# Patient Record
Sex: Female | Born: 1985 | Race: Black or African American | Hispanic: No | Marital: Single | Smoking: Former smoker
Health system: Southern US, Community
[De-identification: ages and names within clinical notes are randomized; demographics above are authoritative.]

## PROBLEM LIST (undated history)

## (undated) DIAGNOSIS — T1490XA Injury, unspecified, initial encounter: Secondary | ICD-10-CM

## (undated) DIAGNOSIS — F32A Depression, unspecified: Secondary | ICD-10-CM

## (undated) DIAGNOSIS — F329 Major depressive disorder, single episode, unspecified: Secondary | ICD-10-CM

## (undated) HISTORY — DX: Major depressive disorder, single episode, unspecified: F32.9

## (undated) HISTORY — DX: Depression, unspecified: F32.A

## (undated) HISTORY — DX: Injury, unspecified, initial encounter: T14.90XA

---

## 2007-03-29 ENCOUNTER — Other Ambulatory Visit: Admission: RE | Admit: 2007-03-29 | Discharge: 2007-03-29 | Payer: Self-pay | Admitting: Obstetrics and Gynecology

## 2007-06-08 ENCOUNTER — Emergency Department (HOSPITAL_COMMUNITY): Admission: EM | Admit: 2007-06-08 | Discharge: 2007-06-08 | Payer: Self-pay | Admitting: Emergency Medicine

## 2007-06-25 ENCOUNTER — Emergency Department (HOSPITAL_COMMUNITY): Admission: EM | Admit: 2007-06-25 | Discharge: 2007-06-25 | Payer: Self-pay | Admitting: Emergency Medicine

## 2007-09-14 ENCOUNTER — Ambulatory Visit (HOSPITAL_COMMUNITY): Admission: RE | Admit: 2007-09-14 | Discharge: 2007-09-14 | Payer: Self-pay | Admitting: Obstetrics & Gynecology

## 2007-11-13 ENCOUNTER — Ambulatory Visit: Payer: Self-pay | Admitting: Obstetrics and Gynecology

## 2007-11-13 ENCOUNTER — Inpatient Hospital Stay (HOSPITAL_COMMUNITY): Admission: AD | Admit: 2007-11-13 | Discharge: 2007-11-13 | Payer: Self-pay | Admitting: Gynecology

## 2007-11-16 ENCOUNTER — Ambulatory Visit: Payer: Self-pay | Admitting: Obstetrics & Gynecology

## 2007-11-16 ENCOUNTER — Inpatient Hospital Stay (HOSPITAL_COMMUNITY): Admission: AD | Admit: 2007-11-16 | Discharge: 2007-11-18 | Payer: Self-pay | Admitting: Family Medicine

## 2008-03-26 ENCOUNTER — Other Ambulatory Visit: Admission: RE | Admit: 2008-03-26 | Discharge: 2008-03-26 | Payer: Self-pay | Admitting: Obstetrics and Gynecology

## 2010-05-11 ENCOUNTER — Encounter: Payer: Self-pay | Admitting: Obstetrics & Gynecology

## 2011-01-16 LAB — CBC
Hemoglobin: 11 — ABNORMAL LOW
Platelets: 154
RBC: 3.73 — ABNORMAL LOW
RDW: 13.7
WBC: 13.4 — ABNORMAL HIGH
WBC: 7.8

## 2011-01-16 LAB — RPR: RPR Ser Ql: NONREACTIVE

## 2011-10-09 ENCOUNTER — Emergency Department (HOSPITAL_COMMUNITY)
Admission: EM | Admit: 2011-10-09 | Discharge: 2011-10-09 | Disposition: A | Discharge status: Home / Self Care | Payer: Self-pay | Attending: Emergency Medicine | Admitting: Emergency Medicine

## 2011-10-09 ENCOUNTER — Encounter (HOSPITAL_COMMUNITY): Payer: Self-pay | Admitting: *Deleted

## 2011-10-09 DIAGNOSIS — S0181XA Laceration without foreign body of other part of head, initial encounter: Secondary | ICD-10-CM

## 2011-10-09 DIAGNOSIS — Z23 Encounter for immunization: Secondary | ICD-10-CM | POA: Insufficient documentation

## 2011-10-09 DIAGNOSIS — S0180XA Unspecified open wound of other part of head, initial encounter: Secondary | ICD-10-CM | POA: Insufficient documentation

## 2011-10-09 MED ORDER — HYDROCODONE-ACETAMINOPHEN 5-325 MG PO TABS
2.0000 | ORAL_TABLET | Freq: Once | ORAL | Status: DC
  Filled 2011-10-09: qty 2

## 2011-10-09 MED ORDER — IBUPROFEN 800 MG PO TABS
800.0000 mg | ORAL_TABLET | Freq: Once | ORAL | Status: AC
  Administered 2011-10-09: 800 mg via ORAL
  Filled 2011-10-09: qty 1

## 2011-10-09 MED ORDER — TETANUS-DIPHTH-ACELL PERTUSSIS 5-2.5-18.5 LF-MCG/0.5 IM SUSP
0.5000 mL | Freq: Once | INTRAMUSCULAR | Status: AC
  Administered 2011-10-09: 0.5 mL via INTRAMUSCULAR
  Filled 2011-10-09: qty 0.5

## 2011-10-09 MED ORDER — ONDANSETRON HCL 4 MG PO TABS
4.0000 mg | ORAL_TABLET | Freq: Once | ORAL | Status: AC
  Administered 2011-10-09: 4 mg via ORAL
  Filled 2011-10-09: qty 1

## 2011-10-09 NOTE — Discharge Instructions (Signed)
Your laceration was repaired with Dermabond. This will begin to peel off on its own in about 5-7 days. Please return if any signs of infection. Your tetanus was up dated today. Please mark your records.

## 2011-10-09 NOTE — ED Notes (Signed)
Pt alert & oriented x4, stable gait. Pt given discharge instructions, paperwork. Patient instructed to stop at the registration desk to finish any additional paperwork. pt verbalized understanding. Pt left department w/ no further questions.  
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
## 2011-10-09 NOTE — ED Provider Notes (Signed)
Medical screening examination/treatment/procedure(s) were performed by non-physician practitioner and as supervising physician I was immediately available for consultation/collaboration.   [name], MD 10/09/11 1929 
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
## 2012-03-13 ENCOUNTER — Emergency Department (HOSPITAL_COMMUNITY)
Admission: EM | Admit: 2012-03-13 | Discharge: 2012-03-13 | Disposition: A | Discharge status: Home / Self Care | Payer: Self-pay | Attending: Emergency Medicine | Admitting: Emergency Medicine

## 2012-03-13 ENCOUNTER — Encounter (HOSPITAL_COMMUNITY): Payer: Self-pay

## 2012-03-13 DIAGNOSIS — F3289 Other specified depressive episodes: Secondary | ICD-10-CM | POA: Insufficient documentation

## 2012-03-13 DIAGNOSIS — F329 Major depressive disorder, single episode, unspecified: Secondary | ICD-10-CM | POA: Insufficient documentation

## 2012-03-13 DIAGNOSIS — R45851 Suicidal ideations: Secondary | ICD-10-CM | POA: Insufficient documentation

## 2012-03-13 LAB — CBC WITH DIFFERENTIAL/PLATELET
Basophils Absolute: 0 10*3/uL (ref 0.0–0.1)
Basophils Relative: 0 % (ref 0–1)
Eosinophils Relative: 2 % (ref 0–5)
HCT: 40.6 % (ref 36.0–46.0)
Hemoglobin: 13.7 g/dL (ref 12.0–15.0)
Lymphocytes Relative: 35 % (ref 12–46)
MCH: 29.1 pg (ref 26.0–34.0)
Neutro Abs: 4.6 10*3/uL (ref 1.7–7.7)
Neutrophils Relative %: 57 % (ref 43–77)
RBC: 4.71 MIL/uL (ref 3.87–5.11)

## 2012-03-13 LAB — BASIC METABOLIC PANEL
CO2: 27 mEq/L (ref 19–32)
Calcium: 9.5 mg/dL (ref 8.4–10.5)
Creatinine, Ser: 0.82 mg/dL (ref 0.50–1.10)
GFR calc Af Amer: >90 mL/min (ref >90)
GFR calc non Af Amer: >90 mL/min (ref >90)
Sodium: 138 mEq/L (ref 135–145)

## 2012-03-13 LAB — RAPID URINE DRUG SCREEN, HOSP PERFORMED
Opiates: NOT DETECTED
Tetrahydrocannabinol: POSITIVE — AB

## 2012-03-13 LAB — ETHANOL: Alcohol, Ethyl (B): <11 mg/dL (ref 0–11)

## 2012-03-13 MED ORDER — CITALOPRAM HYDROBROMIDE 20 MG PO TABS: 20.0000 mg | ORAL_TABLET | Freq: Every day | ORAL | Status: DC

## 2012-03-13 MED ORDER — CITALOPRAM HYDROBROMIDE 40 MG PO TABS: 20.0000 mg | ORAL_TABLET | Freq: Every day | ORAL | Status: DC

## 2012-03-13 NOTE — ED Notes (Signed)
Pt states she has been unhappy for a few months. States she lay in the bed last night thinking about hanging herself

## 2012-03-14 NOTE — ED Provider Notes (Signed)
History     CSN: [id number]  Arrival date & time 03/13/12  0840   First MD Initiated Contact with Patient 03/13/12 [phone]      Chief Complaint  Patient presents with  . V70.1    (Consider location/radiation/quality/duration/timing/severity/associated sxs/prior treatment) HPI Comments: Krista Monroe presents voluntarily for assistance with worsening depression.  She reports being increasingly sad and easily tearful for the past several months.  She has had no obvious life stressors or personal losses that might explain her sadness.  She reports having days when she feels ok, but has more days with sadness and tearfulness.  Last night she had thoughts of hanging herself, yet has a 43 year old daughter and does not believe she could act on these thoughts.  She denies any significant medical history and does not have a local pcp.  She lives with her daughter,  Boyfriend and her sister.  Her boyfriend brought her here today at her request.   The history is provided by the patient.    History reviewed. No pertinent past medical history.  History reviewed. No pertinent past surgical history.  No family history on file.  History  Substance Use Topics  . Smoking status: Never Smoker   . Smokeless tobacco: Not on file  . Alcohol Use: Yes    OB History    Grav Para Term Preterm Abortions TAB SAB Ect Mult Living                  Review of Systems  Constitutional: Negative for fever.  HENT: Negative for congestion, sore throat and neck pain.   Eyes: Negative.   Respiratory: Negative for chest tightness and shortness of breath.   Cardiovascular: Negative for chest pain.  Gastrointestinal: Negative for nausea and abdominal pain.  Genitourinary: Negative.   Musculoskeletal: Negative for joint swelling and arthralgias.  Skin: Negative.  Negative for rash and wound.  Neurological: Negative for dizziness, weakness, light-headedness, numbness and headaches.  Hematological: Negative.     Psychiatric/Behavioral: Positive for suicidal ideas. Negative for hallucinations and self-injury. The patient is not nervous/anxious.     Allergies  Review of patient's allergies indicates no known allergies.  Home Medications   Current Outpatient Rx  Name  Route  Sig  Dispense  Refill  . CITALOPRAM HYDROBROMIDE 40 MG PO TABS   Oral   Take 0.5 tablets (20 mg total) by mouth daily.   30 tablet   0     BP 128/79  Pulse 68  Temp 98.4 F (36.9 C) (Oral)  Resp 18  Ht 5\' 5"  (1.651 m)  Wt 170 lb (77.111 kg)  BMI 28.29 kg/m2  SpO2 100%  LMP 03/06/2012  Physical Exam  Nursing note and vitals reviewed. Constitutional: She appears well-developed and well-nourished.  HENT:  Head: Normocephalic and atraumatic.  Eyes: Conjunctivae normal are normal.  Neck: Normal range of motion.  Cardiovascular: Normal rate, regular rhythm, normal heart sounds and intact distal pulses.   Pulmonary/Chest: Effort normal and breath sounds normal. She has no wheezes.  Abdominal: Soft. Bowel sounds are normal. There is no tenderness.  Musculoskeletal: Normal range of motion.  Neurological: She is alert.  Skin: Skin is warm and dry.  Psychiatric: Her speech is normal. Judgment normal. Her affect is blunt. Thought content is not paranoid. Cognition and memory are normal. She exhibits a depressed mood. She expresses no homicidal ideation.    ED Course  Procedures (including critical care time)  Labs Reviewed  URINE RAPID  DRUG SCREEN (HOSP PERFORMED) - Abnormal; Notable for the following:    Tetrahydrocannabinol POSITIVE (*)     All other components within normal limits  CBC WITH DIFFERENTIAL  BASIC METABOLIC PANEL  ETHANOL  LAB REPORT - SCANNED   No results found.   1. Depression    Pt was evaluated by telepsych at length.  It was felt that patient does have mild to moderate depression but without risk for active suicidal act.  Recommended celexa 20 mg daily and close f/u with behavioral  health.     MDM  Discussed with Dr. Estell Harpin prior to dc home.  Pt was prescribed celexa,  Given phone numbers to establish care with [facility],  Also given crisis 24 hour number. Asked to return here for a recheck if sx worsen at any point prior to getting established with [facility] which pt agrees to do.          Burgess Amor, PA 03/15/12 0002

## 2012-03-16 NOTE — ED Provider Notes (Signed)
Medical screening examination/treatment/procedure(s) were performed by non-physician practitioner and as supervising physician I was immediately available for consultation/collaboration.   Janila Arrazola L Shondale Quinley, MD 03/16/12 1501 

## 2013-02-22 ENCOUNTER — Encounter: Payer: Self-pay | Admitting: Obstetrics and Gynecology

## 2013-05-17 ENCOUNTER — Encounter (INDEPENDENT_AMBULATORY_CARE_PROVIDER_SITE_OTHER): Payer: Self-pay

## 2013-05-17 ENCOUNTER — Encounter: Payer: BC Managed Care – PPO | Admitting: Adult Health

## 2014-08-01 ENCOUNTER — Other Ambulatory Visit: Payer: Self-pay | Admitting: Obstetrics & Gynecology

## 2014-08-13 ENCOUNTER — Other Ambulatory Visit (HOSPITAL_COMMUNITY)
Admission: RE | Admit: 2014-08-13 | Discharge: 2014-08-13 | Disposition: A | Discharge status: Home / Self Care | Payer: BLUE CROSS/BLUE SHIELD | Source: Ambulatory Visit | Attending: Obstetrics & Gynecology | Admitting: Obstetrics & Gynecology

## 2014-08-13 ENCOUNTER — Encounter: Payer: Self-pay | Admitting: Obstetrics & Gynecology

## 2014-08-13 ENCOUNTER — Ambulatory Visit (INDEPENDENT_AMBULATORY_CARE_PROVIDER_SITE_OTHER): Payer: BLUE CROSS/BLUE SHIELD | Admitting: Obstetrics & Gynecology

## 2014-08-13 ENCOUNTER — Telehealth: Payer: Self-pay | Admitting: Obstetrics & Gynecology

## 2014-08-13 VITALS — BP 110/62 | HR 76 | Ht 65.0 in | Wt 172.5 lb

## 2014-08-13 DIAGNOSIS — Z01419 Encounter for gynecological examination (general) (routine) without abnormal findings: Secondary | ICD-10-CM | POA: Diagnosis not present

## 2014-08-13 NOTE — Telephone Encounter (Signed)
Spoke with pt. Pt was seen earlier. She wants a refill on Celexa. She has had 2  deaths in her family recently. Please advise. Thanks!! JSY

## 2014-08-13 NOTE — Progress Notes (Signed)
Patient ID: Krista HawkingAntica L Novello, female   DOB: Nov 07, 1985, 29 y.o.   MRN: [id number] Subjective:     Krista Monroe is a 29 y.o. female here for a routine exam.  Patient's last menstrual period was 08/07/2014. No obstetric history on file. Birth Control Method:  Nexplanon(in for 7 years) Menstrual Calendar(currently): regular  Current complaints: none.   Current acute medical issues:  none   Recent Gynecologic History Patient's last menstrual period was 08/07/2014. Last Pap: 2011,  normal Last mammogram: ,    Past Medical History  Diagnosis Date  . Depression     History reviewed. No pertinent past surgical history.  OB History    No data available      History   Social History  . Marital Status: Single    Spouse Name: N/A  . Number of Children: N/A  . Years of Education: N/A   Social History Main Topics  . Smoking status: Never Smoker   . Smokeless tobacco: Never Used  . Alcohol Use: Yes     Comment: 2-3 times a month  . Drug Use: No  . Sexual Activity: Yes    Birth Control/ Protection: Implant   Other Topics Concern  . None   Social History Narrative    Family History  Problem Relation Age of Onset  . Cancer Maternal Grandmother     breast     Current outpatient prescriptions:  .  etonogestrel (NEXPLANON) 68 MG IMPL implant, 1 each by Subdermal route once., Disp: , Rfl:   Review of Systems  Review of Systems  Constitutional: Negative for fever, chills, weight loss, malaise/fatigue and diaphoresis.  HENT: Negative for hearing loss, ear pain, nosebleeds, congestion, sore throat, neck pain, tinnitus and ear discharge.   Eyes: Negative for blurred vision, double vision, photophobia, pain, discharge and redness.  Respiratory: Negative for cough, hemoptysis, sputum production, shortness of breath, wheezing and stridor.   Cardiovascular: Negative for chest pain, palpitations, orthopnea, claudication, leg swelling and PND.  Gastrointestinal: negative for  abdominal pain. Negative for heartburn, nausea, vomiting, diarrhea, constipation, blood in stool and melena.  Genitourinary: Negative for dysuria, urgency, frequency, hematuria and flank pain.  Musculoskeletal: Negative for myalgias, back pain, joint pain and falls.  Skin: Negative for itching and rash.  Neurological: Negative for dizziness, tingling, tremors, sensory change, speech change, focal weakness, seizures, loss of consciousness, weakness and headaches.  Endo/Heme/Allergies: Negative for environmental allergies and polydipsia. Does not bruise/bleed easily.  Psychiatric/Behavioral: Negative for depression, suicidal ideas, hallucinations, memory loss and substance abuse. The patient is not nervous/anxious and does not have insomnia.        Objective:  Blood pressure 110/62, pulse 76, height 5\' 5"  (1.651 m), weight 172 lb 8 oz (78.245 kg), last menstrual period 08/07/2014.   Physical Exam  Vitals reviewed. Constitutional: She is oriented to person, place, and time. She appears well-developed and well-nourished.  HENT:  Head: Normocephalic and atraumatic.        Right Ear: External ear normal.  Left Ear: External ear normal.  Nose: Nose normal.  Mouth/Throat: Oropharynx is clear and moist.  Eyes: Conjunctivae and EOM are normal. Pupils are equal, round, and reactive to light. Right eye exhibits no discharge. Left eye exhibits no discharge. No scleral icterus.  Neck: Normal range of motion. Neck supple. No tracheal deviation present. No thyromegaly present.  Cardiovascular: Normal rate, regular rhythm, normal heart sounds and intact distal pulses.  Exam reveals no gallop and no friction rub.   No  murmur heard. Respiratory: Effort normal and breath sounds normal. No respiratory distress. She has no wheezes. She has no rales. She exhibits no tenderness.  GI: Soft. Bowel sounds are normal. She exhibits no distension and no mass. There is no tenderness. There is no rebound and no guarding.   Genitourinary:  Breasts no masses skin changes or nipple changes bilaterally      Vulva is normal without lesions Vagina is pink moist without discharge Cervix normal in appearance and pap is done Uterus is normal size shape and contour Adnexa is negative with normal sized ovaries  Musculoskeletal: Normal range of motion. She exhibits no edema and no tenderness.  Neurological: She is alert and oriented to person, place, and time. She has normal reflexes. She displays normal reflexes. No cranial nerve deficit. She exhibits normal muscle tone. Coordination normal.  Skin: Skin is warm and dry. No rash noted. No erythema. No pallor.  Psychiatric: She has a normal mood and affect. Her behavior is normal. Judgment and thought content normal.       Assessment:    Healthy female exam.    Plan:    Contraception: Nexplanon. Follow up in: 2 weeks.   for nexplanon removal and reinsertion

## 2014-08-14 ENCOUNTER — Telehealth: Payer: Self-pay | Admitting: Obstetrics & Gynecology

## 2014-08-14 MED ORDER — CITALOPRAM HYDROBROMIDE 40 MG PO TABS: 20.0000 mg | ORAL_TABLET | Freq: Every day | ORAL | Status: DC

## 2014-08-15 LAB — CYTOLOGY - PAP

## 2014-08-20 ENCOUNTER — Other Ambulatory Visit: Payer: Self-pay | Admitting: Obstetrics & Gynecology

## 2014-08-23 ENCOUNTER — Ambulatory Visit (INDEPENDENT_AMBULATORY_CARE_PROVIDER_SITE_OTHER): Payer: BLUE CROSS/BLUE SHIELD | Admitting: Otolaryngology

## 2014-08-28 ENCOUNTER — Encounter: Payer: BLUE CROSS/BLUE SHIELD | Admitting: Obstetrics & Gynecology

## 2014-08-28 ENCOUNTER — Encounter: Payer: Self-pay | Admitting: *Deleted

## 2014-09-10 ENCOUNTER — Encounter: Payer: BLUE CROSS/BLUE SHIELD | Admitting: Obstetrics & Gynecology

## 2014-09-18 ENCOUNTER — Encounter: Payer: Self-pay | Admitting: Obstetrics & Gynecology

## 2014-09-18 ENCOUNTER — Encounter: Payer: BLUE CROSS/BLUE SHIELD | Admitting: Obstetrics & Gynecology

## 2014-09-27 ENCOUNTER — Ambulatory Visit (INDEPENDENT_AMBULATORY_CARE_PROVIDER_SITE_OTHER): Payer: BLUE CROSS/BLUE SHIELD | Admitting: Otolaryngology

## 2015-06-13 ENCOUNTER — Ambulatory Visit: Payer: BLUE CROSS/BLUE SHIELD | Admitting: Obstetrics and Gynecology

## 2015-06-13 ENCOUNTER — Encounter: Payer: Self-pay | Admitting: *Deleted

## 2015-06-14 ENCOUNTER — Encounter (HOSPITAL_COMMUNITY): Payer: Self-pay | Admitting: Emergency Medicine

## 2015-06-14 ENCOUNTER — Emergency Department (HOSPITAL_COMMUNITY)
Admission: EM | Admit: 2015-06-14 | Discharge: 2015-06-14 | Disposition: A | Discharge status: Home / Self Care | Payer: BLUE CROSS/BLUE SHIELD | Attending: Emergency Medicine | Admitting: Emergency Medicine

## 2015-06-14 DIAGNOSIS — N611 Abscess of the breast and nipple: Secondary | ICD-10-CM | POA: Insufficient documentation

## 2015-06-14 DIAGNOSIS — F329 Major depressive disorder, single episode, unspecified: Secondary | ICD-10-CM | POA: Insufficient documentation

## 2015-06-14 DIAGNOSIS — Z79899 Other long term (current) drug therapy: Secondary | ICD-10-CM | POA: Insufficient documentation

## 2015-06-14 DIAGNOSIS — F17200 Nicotine dependence, unspecified, uncomplicated: Secondary | ICD-10-CM | POA: Insufficient documentation

## 2015-06-14 MED ORDER — SULFAMETHOXAZOLE-TRIMETHOPRIM 800-160 MG PO TABS
1.0000 | ORAL_TABLET | Freq: Once | ORAL | Status: AC
  Administered 2015-06-14: 1 via ORAL
  Filled 2015-06-14: qty 1

## 2015-06-14 MED ORDER — HYDROCODONE-ACETAMINOPHEN 5-325 MG PO TABS
1.0000 | ORAL_TABLET | Freq: Once | ORAL | Status: AC
  Administered 2015-06-14: 1 via ORAL
  Filled 2015-06-14: qty 1

## 2015-06-14 MED ORDER — HYDROCODONE-ACETAMINOPHEN 5-325 MG PO TABS: 1.0000 | ORAL_TABLET | ORAL | Status: DC | PRN

## 2015-06-14 MED ORDER — SULFAMETHOXAZOLE-TRIMETHOPRIM 800-160 MG PO TABS: 1.0000 | ORAL_TABLET | Freq: Two times a day (BID) | ORAL | Status: AC

## 2015-06-14 NOTE — ED Provider Notes (Signed)
CSN: [id number]     Arrival date & time 06/14/15  1829 History   First MD Initiated Contact with Patient 06/14/15 2017     Chief Complaint  Patient presents with  . Abscess     (Consider location/radiation/quality/duration/timing/severity/associated sxs/prior Treatment) The history is provided by the patient.   Krista Monroe is a 30 y.o. female presenting with worsening pain and swelling of her right breast with nipple discharge occuring yesterday, but none since.  She had her nipples pierced 4 months ago and has had some chronic nipple irritation associated with the piercings so took them out 2 weeks ago when right breast started to get sore.  Since she has had increasing pain, swelling and hardness along her inferior breast with small amount of white discharge from the nipple yesterday.  She has tried warm compresses without relief of symptoms. She has had no fevers, chills, nausea or vomiting.  LMP unknown on Nexplanon.       Past Medical History  Diagnosis Date  . Depression    History reviewed. No pertinent past surgical history. Family History  Problem Relation Age of Onset  . Cancer Maternal Grandmother     breast   Social History  Substance Use Topics  . Smoking status: Current Every Day Smoker  . Smokeless tobacco: Never Used  . Alcohol Use: Yes     Comment: 2-3 times a month   OB History    No data available     Review of Systems  Constitutional: Negative for fever and chills.  HENT: Negative for congestion and sore throat.   Eyes: Negative.   Respiratory: Negative for chest tightness and shortness of breath.   Cardiovascular: Negative for chest pain.  Gastrointestinal: Negative for nausea, vomiting and abdominal pain.  Genitourinary: Negative.   Musculoskeletal: Negative for joint swelling, arthralgias and neck pain.  Skin: Positive for color change. Negative for rash and wound.  Neurological: Negative for dizziness, weakness, light-headedness, numbness  and headaches.  Psychiatric/Behavioral: Negative.       Allergies  Review of patient's allergies indicates no known allergies.  Home Medications   Prior to Admission medications   Medication Sig Start Date End Date Taking? Authorizing Provider  citalopram (CELEXA) 40 MG tablet Take 0.5 tablets (20 mg total) by mouth daily. 08/14/14   Lazaro Arms, MD  etonogestrel (NEXPLANON) 68 MG IMPL implant 1 each by Subdermal route once.    Historical Provider, MD  HYDROcodone-acetaminophen (NORCO/VICODIN) 5-325 MG tablet Take 1-2 tablets by mouth every 4 (four) hours as needed. 06/14/15   Burgess Amor, PA-C  sulfamethoxazole-trimethoprim (BACTRIM DS,SEPTRA DS) 800-160 MG tablet Take 1 tablet by mouth 2 (two) times daily. 06/14/15 06/21/15  Burgess Amor, PA-C   BP 137/64 mmHg  Pulse 84  Temp(Src) 99.5 F (37.5 C) (Oral)  Resp 14  Ht  (1.651 m)  Wt 77.111 kg  BMI 28.29 kg/m2  SpO2 100%  LMP 06/05/2015 Physical Exam  Constitutional: She appears well-developed and well-nourished.  HENT:  Head: Normocephalic and atraumatic.  Eyes: Conjunctivae are normal.  Neck: Normal range of motion.  Cardiovascular: Normal rate, regular rhythm, normal heart sounds and intact distal pulses.   Pulmonary/Chest: Effort normal and breath sounds normal. She has no wheezes.  Abdominal: Soft. Bowel sounds are normal. There is no tenderness.  Musculoskeletal: Normal range of motion.  Lymphadenopathy:    She has no cervical adenopathy.    She has no axillary adenopathy.  Neurological: She is alert.  Skin: Skin  is warm and dry.  Approximate 8 cm area of tender induration right infer and lateal breast extending to the nipple. No drainage. Skin intact without area of pointing. Nipple is soft without discharge.   Psychiatric: She has a normal mood and affect.  Nursing note and vitals reviewed.   ED Course  Procedures (including critical care time) Labs Review Labs Reviewed - No data to display  Imaging  Review No results found. I have personally reviewed and evaluated these images and lab results as part of my medical decision-making.   EKG Interpretation None      MDM   Final diagnoses:  Breast abscess    Discussed with Dr. Lovell Sheehan who agrees with abx and warm soaks, close office f/u.  He will see her in 4 days in his office, in the interim, requested [place] which has been scheduled for Monday am.  Bactrim, hydrocodone prescribed.  Discussed reasons to return sooner including worsened pain, swelling, fevers or any new sx.  Pt aware and agrees with plan.     Burgess Amor, PA-C 06/15/15 1204  Samuel Jester, DO 06/18/15 1227

## 2015-06-14 NOTE — ED Notes (Signed)
Patient states "I had my nipple pierced but two weeks ago I noticed some swelling and pain in my right breast so I took the piercing out but now its swollen and hurting worse." States area has drained "white and creamy" drainage.

## 2015-06-16 ENCOUNTER — Other Ambulatory Visit (HOSPITAL_COMMUNITY): Payer: Self-pay | Admitting: Emergency Medicine

## 2015-06-16 DIAGNOSIS — N611 Abscess of the breast and nipple: Secondary | ICD-10-CM

## 2015-06-17 ENCOUNTER — Inpatient Hospital Stay (HOSPITAL_COMMUNITY): Admit: 2015-06-17 | Payer: BLUE CROSS/BLUE SHIELD

## 2015-06-18 ENCOUNTER — Ambulatory Visit (HOSPITAL_COMMUNITY)
Admission: RE | Admit: 2015-06-18 | Discharge: 2015-06-18 | Disposition: A | Discharge status: Home / Self Care | Payer: BLUE CROSS/BLUE SHIELD | Source: Ambulatory Visit | Attending: Emergency Medicine | Admitting: Emergency Medicine

## 2015-06-18 DIAGNOSIS — N611 Abscess of the breast and nipple: Secondary | ICD-10-CM | POA: Insufficient documentation

## 2015-06-20 ENCOUNTER — Ambulatory Visit (INDEPENDENT_AMBULATORY_CARE_PROVIDER_SITE_OTHER): Payer: BLUE CROSS/BLUE SHIELD | Admitting: Otolaryngology

## 2016-06-20 ENCOUNTER — Emergency Department (HOSPITAL_COMMUNITY): Payer: Self-pay

## 2016-06-20 ENCOUNTER — Encounter (HOSPITAL_COMMUNITY): Payer: Self-pay | Admitting: Cardiology

## 2016-06-20 ENCOUNTER — Emergency Department (HOSPITAL_COMMUNITY)
Admission: EM | Admit: 2016-06-20 | Discharge: 2016-06-20 | Disposition: A | Discharge status: Home / Self Care | Payer: Self-pay | Attending: Emergency Medicine | Admitting: Emergency Medicine

## 2016-06-20 DIAGNOSIS — R059 Cough, unspecified: Secondary | ICD-10-CM

## 2016-06-20 DIAGNOSIS — R042 Hemoptysis: Secondary | ICD-10-CM | POA: Insufficient documentation

## 2016-06-20 DIAGNOSIS — R05 Cough: Secondary | ICD-10-CM

## 2016-06-20 DIAGNOSIS — F17210 Nicotine dependence, cigarettes, uncomplicated: Secondary | ICD-10-CM | POA: Insufficient documentation

## 2016-06-20 NOTE — ED Provider Notes (Signed)
[facility] Provider Note   CSN: [id number] Arrival date & time: 06/20/16  0740     History   Chief Complaint Chief Complaint  Patient presents with  . Hemoptysis    HPI Krista Monroe is a 31 y.o. female.  Patient presents with hemoptysis since last night. Patient's had cough congestion for 2 days and had 2 episodes of light or color blood. No history of similar. Patient denies risk factors for blood clots or bleeding disorders. No blood thinners. Patient is not short of breath. No leg swelling. No significant sick contacts no recent travel. Patient is a cigarette smoker.      Past Medical History:  Diagnosis Date  . Depression     There are no active problems to display for this patient.   History reviewed. No pertinent surgical history.  OB History    No data available       Home Medications    Prior to Admission medications   Medication Sig Start Date End Date Taking? Authorizing Provider  citalopram (CELEXA) 40 MG tablet Take 0.5 tablets (20 mg total) by mouth daily. 08/14/14   Lazaro ArmsLuther H Eure, MD  etonogestrel (NEXPLANON) 68 MG IMPL implant 1 each by Subdermal route once.    Historical Provider, MD  HYDROcodone-acetaminophen (NORCO/VICODIN) 5-325 MG tablet Take 1-2 tablets by mouth every 4 (four) hours as needed. 06/14/15   Burgess AmorJulie Idol, PA-C    Family History Family History  Problem Relation Age of Onset  . Cancer Maternal Grandmother     breast    Social History Social History  Substance Use Topics  . Smoking status: Current Every Day Smoker  . Smokeless tobacco: Never Used  . Alcohol use Yes     Comment: 2-3 times a month     Allergies   Patient has no known allergies.   Review of Systems Review of Systems  Constitutional: Negative for chills and fever.  HENT: Negative for congestion.   Eyes: Negative for visual disturbance.  Respiratory: Positive for cough. Negative for shortness of breath.   Cardiovascular: Negative for chest  pain and leg swelling.  Gastrointestinal: Negative for abdominal pain and vomiting.  Genitourinary: Negative for dysuria and flank pain.  Musculoskeletal: Negative for back pain, neck pain and neck stiffness.  Skin: Negative for rash.  Neurological: Negative for light-headedness and headaches.     Physical Exam Updated Vital Signs BP 112/64 (BP Location: Right Arm)   Pulse 72   Temp 98 F (36.7 C) (Oral)   Resp 16   Ht 5\' 5"  (1.651 m)   Wt 170 lb (77.1 kg)   SpO2 100%   BMI 28.29 kg/m   Physical Exam  Constitutional: She appears well-developed and well-nourished. No distress.  HENT:  Head: Normocephalic and atraumatic.  Eyes: Conjunctivae are normal.  Neck: Neck supple.  Cardiovascular: Normal rate and regular rhythm.   No murmur heard. Pulmonary/Chest: Effort normal and breath sounds normal. No respiratory distress.  Abdominal: Soft. There is no tenderness.  Musculoskeletal: She exhibits no edema.  Neurological: She is alert.  Skin: Skin is warm and dry.  Psychiatric: She has a normal mood and affect.  Nursing note and vitals reviewed.    ED Treatments / Results  Labs (all labs ordered are listed, but only abnormal results are displayed) Labs Reviewed - No data to display  EKG  EKG Interpretation None       Radiology Dg Chest 2 View  Result Date: 06/20/2016 CLINICAL DATA:  Cough  and dyspnea. EXAM: CHEST  2 VIEW COMPARISON:  None. FINDINGS: Normal heart size. Normal mediastinal contour. No pneumothorax. No pleural effusion. Lungs appear clear, with no acute consolidative airspace disease and no pulmonary edema. IMPRESSION: No active cardiopulmonary disease. Electronically Signed   By: Delbert Phenix M.D.   On: 06/20/2016 08:44    Procedures Procedures (including critical care time)  Medications Ordered in ED Medications - No data to display   Initial Impression / Assessment and Plan / ED Course  I have reviewed the triage vital signs and the nursing  notes.  Pertinent labs & imaging results that were available during my care of the patient were reviewed by me and considered in my medical decision making (see chart for details).   patient well-appearing presents after 2 mild episodes of hemoptysis coughing. Patient has no blood clot risk factors is not short of breath vital signs unremarkable. Clinically most likely viral respiratory infection and should clear up in the near future. Discussed reasons to return. No indication for blood work or further workup at this time.  Smoking cessation and outpatient follow up was discussed with the patient for 3 to 5  Minutes.   Results and differential diagnosis were discussed with the patient/parent/guardian. Xrays were independently reviewed by myself.  Close follow up outpatient was discussed, comfortable with the plan.   Medications - No data to display  Vitals:   06/20/16 0749  BP: 112/64  Pulse: 72  Resp: 16  Temp: 98 F (36.7 C)  TempSrc: Oral  SpO2: 100%  Weight: 170 lb (77.1 kg)  Height: 5\' 5"  (1.651 m)    Final diagnoses:  Hemoptysis  Cough     Final Clinical Impressions(s) / ED Diagnoses   Final diagnoses:  Hemoptysis  Cough    New Prescriptions New Prescriptions   No medications on file     Blane Ohara, MD 06/20/16 [phone]

## 2016-06-20 NOTE — Discharge Instructions (Signed)
Please quit smoking she is possible. Follow-up with your primary doctor next week. Return the ER if he develop shortness of breath, leg swelling, fevers or new concerns.  If you were given medicines take as directed.  If you are on coumadin or contraceptives realize their levels and effectiveness is altered by many different medicines.  If you have any reaction (rash, tongues swelling, other) to the medicines stop taking and see a physician.    If your blood pressure was elevated in the ER make sure you follow up for management with a primary doctor or return for chest pain, shortness of breath or stroke symptoms.  Please follow up as directed and return to the ER or see a physician for new or worsening symptoms.  Thank you. Vitals:   06/20/16 0749  BP: 112/64  Pulse: 72  Resp: 16  Temp: 98 F (36.7 C)  TempSrc: Oral  SpO2: 100%  Weight: 170 lb (77.1 kg)  Height: 5\' 5"  (1.651 m)

## 2016-06-20 NOTE — ED Triage Notes (Signed)
Coughing up blood since last night.

## 2016-10-24 ENCOUNTER — Emergency Department (HOSPITAL_COMMUNITY): Payer: No Typology Code available for payment source

## 2016-10-24 ENCOUNTER — Emergency Department (HOSPITAL_COMMUNITY)
Admission: EM | Admit: 2016-10-24 | Discharge: 2016-10-24 | Disposition: A | Discharge status: Home / Self Care | Payer: No Typology Code available for payment source | Attending: Emergency Medicine | Admitting: Emergency Medicine

## 2016-10-24 ENCOUNTER — Encounter (HOSPITAL_COMMUNITY): Payer: Self-pay

## 2016-10-24 DIAGNOSIS — S51012A Laceration without foreign body of left elbow, initial encounter: Secondary | ICD-10-CM | POA: Diagnosis not present

## 2016-10-24 DIAGNOSIS — Y92410 Unspecified street and highway as the place of occurrence of the external cause: Secondary | ICD-10-CM | POA: Insufficient documentation

## 2016-10-24 DIAGNOSIS — Y939 Activity, unspecified: Secondary | ICD-10-CM | POA: Insufficient documentation

## 2016-10-24 DIAGNOSIS — M546 Pain in thoracic spine: Secondary | ICD-10-CM | POA: Insufficient documentation

## 2016-10-24 DIAGNOSIS — Y999 Unspecified external cause status: Secondary | ICD-10-CM | POA: Insufficient documentation

## 2016-10-24 DIAGNOSIS — F17200 Nicotine dependence, unspecified, uncomplicated: Secondary | ICD-10-CM | POA: Insufficient documentation

## 2016-10-24 DIAGNOSIS — S5002XA Contusion of left elbow, initial encounter: Secondary | ICD-10-CM

## 2016-10-24 MED ORDER — KETOROLAC TROMETHAMINE 60 MG/2ML IM SOLN
30.0000 mg | Freq: Once | INTRAMUSCULAR | Status: AC
  Administered 2016-10-24: 30 mg via INTRAMUSCULAR
  Filled 2016-10-24: qty 2

## 2016-10-24 MED ORDER — NAPROXEN 500 MG PO TABS
500.0000 mg | ORAL_TABLET | Freq: Two times a day (BID) | ORAL | 0 refills | Status: DC
Start: 2016-10-24 — End: 2017-10-05

## 2016-10-24 MED ORDER — METHOCARBAMOL 500 MG PO TABS: 500.0000 mg | ORAL_TABLET | Freq: Four times a day (QID) | ORAL | 0 refills | Status: DC
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
## 2016-10-24 NOTE — Discharge Instructions (Signed)
Your x-rays are normal today. Apply antibiotic ointment to the laceration twice a day. Keep it covered. Elevate your  arm, ice several times a day to reduce swelling. Take naproxen for pain and inflammation. Take Robaxin for muscle spasms, do not take if driving or going to work. Follow-up with your family doctor as needed.

## 2016-10-24 NOTE — ED Notes (Signed)
To radiology

## 2016-10-24 NOTE — ED Notes (Signed)
PA in to assess 
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
## 2016-10-26 ENCOUNTER — Encounter (HOSPITAL_COMMUNITY): Payer: Self-pay | Admitting: Adult Health

## 2016-10-26 ENCOUNTER — Emergency Department (HOSPITAL_COMMUNITY)
Admission: EM | Admit: 2016-10-26 | Discharge: 2016-10-26 | Disposition: A | Discharge status: Home / Self Care | Payer: No Typology Code available for payment source | Attending: Emergency Medicine | Admitting: Emergency Medicine

## 2016-10-26 DIAGNOSIS — Z09 Encounter for follow-up examination after completed treatment for conditions other than malignant neoplasm: Secondary | ICD-10-CM | POA: Diagnosis present

## 2016-10-26 DIAGNOSIS — Z79899 Other long term (current) drug therapy: Secondary | ICD-10-CM | POA: Insufficient documentation

## 2016-10-26 MED ORDER — METHOCARBAMOL 500 MG PO TABS
500.0000 mg | ORAL_TABLET | Freq: Once | ORAL | Status: AC
  Administered 2016-10-26: 500 mg via ORAL
  Filled 2016-10-26: qty 1

## 2016-10-26 MED ORDER — NAPROXEN 250 MG PO TABS
500.0000 mg | ORAL_TABLET | Freq: Once | ORAL | Status: AC
  Administered 2016-10-26: 500 mg via ORAL
  Filled 2016-10-26: qty 2
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
## 2016-11-10 ENCOUNTER — Other Ambulatory Visit (HOSPITAL_COMMUNITY)
Admission: RE | Admit: 2016-11-10 | Discharge: 2016-11-10 | Disposition: A | Discharge status: Home / Self Care | Payer: PRIVATE HEALTH INSURANCE | Source: Ambulatory Visit | Attending: Obstetrics & Gynecology | Admitting: Obstetrics & Gynecology

## 2016-11-10 ENCOUNTER — Ambulatory Visit (INDEPENDENT_AMBULATORY_CARE_PROVIDER_SITE_OTHER): Payer: Medicaid Other | Admitting: Obstetrics & Gynecology

## 2016-11-10 ENCOUNTER — Encounter: Payer: Self-pay | Admitting: Obstetrics & Gynecology

## 2016-11-10 VITALS — BP 122/68 | HR 74 | Ht 67.0 in | Wt 179.0 lb

## 2016-11-10 DIAGNOSIS — Z3009 Encounter for other general counseling and advice on contraception: Secondary | ICD-10-CM | POA: Insufficient documentation

## 2016-11-10 DIAGNOSIS — F331 Major depressive disorder, recurrent, moderate: Secondary | ICD-10-CM

## 2016-11-10 DIAGNOSIS — Z309 Encounter for contraceptive management, unspecified: Secondary | ICD-10-CM

## 2016-11-10 DIAGNOSIS — Z01419 Encounter for gynecological examination (general) (routine) without abnormal findings: Secondary | ICD-10-CM

## 2016-11-10 MED ORDER — CITALOPRAM HYDROBROMIDE 20 MG PO TABS: 20.0000 mg | ORAL_TABLET | Freq: Every day | ORAL | 2 refills | Status: DC

## 2016-11-10 NOTE — Addendum Note (Signed)
Addended by: Tish FredericksonLANCASTER, Adrain Nesbit A on: 11/10/2016 11:32 AM   Modules accepted: Orders
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
## 2016-11-11 LAB — CYTOLOGY - PAP
CHLAMYDIA, DNA PROBE: NEGATIVE
DIAGNOSIS: NEGATIVE
HPV: NOT DETECTED
Neisseria Gonorrhea: NEGATIVE

## 2016-11-11 LAB — RPR: RPR: NONREACTIVE

## 2016-11-11 LAB — HIV ANTIBODY (ROUTINE TESTING W REFLEX): HIV Screen 4th Generation wRfx: NONREACTIVE

## 2016-12-10 ENCOUNTER — Encounter: Payer: Medicaid Other | Admitting: Obstetrics & Gynecology

## 2016-12-18 ENCOUNTER — Encounter: Payer: Medicaid Other | Admitting: Obstetrics & Gynecology

## 2016-12-29 ENCOUNTER — Encounter: Payer: Self-pay | Admitting: Obstetrics & Gynecology

## 2016-12-29 ENCOUNTER — Encounter: Payer: Self-pay | Admitting: *Deleted

## 2016-12-29 ENCOUNTER — Ambulatory Visit: Payer: PRIVATE HEALTH INSURANCE | Admitting: Family Medicine

## 2017-09-09 ENCOUNTER — Encounter: Payer: Self-pay | Admitting: *Deleted

## 2017-09-09 ENCOUNTER — Encounter: Payer: Self-pay | Admitting: Advanced Practice Midwife

## 2017-10-05 ENCOUNTER — Other Ambulatory Visit: Payer: Self-pay

## 2017-10-05 ENCOUNTER — Emergency Department (HOSPITAL_COMMUNITY): Payer: Medicaid Other

## 2017-10-05 ENCOUNTER — Encounter (HOSPITAL_COMMUNITY): Payer: Self-pay | Admitting: Emergency Medicine

## 2017-10-05 ENCOUNTER — Emergency Department (HOSPITAL_COMMUNITY)
Admission: EM | Admit: 2017-10-05 | Discharge: 2017-10-05 | Disposition: A | Discharge status: Home / Self Care | Payer: Medicaid Other | Attending: Emergency Medicine | Admitting: Emergency Medicine

## 2017-10-05 DIAGNOSIS — Z3A01 Less than 8 weeks gestation of pregnancy: Secondary | ICD-10-CM | POA: Diagnosis not present

## 2017-10-05 DIAGNOSIS — R102 Pelvic and perineal pain: Secondary | ICD-10-CM | POA: Diagnosis not present

## 2017-10-05 DIAGNOSIS — Y999 Unspecified external cause status: Secondary | ICD-10-CM | POA: Insufficient documentation

## 2017-10-05 DIAGNOSIS — Y939 Activity, unspecified: Secondary | ICD-10-CM | POA: Insufficient documentation

## 2017-10-05 DIAGNOSIS — Y929 Unspecified place or not applicable: Secondary | ICD-10-CM | POA: Diagnosis not present

## 2017-10-05 DIAGNOSIS — O26891 Other specified pregnancy related conditions, first trimester: Secondary | ICD-10-CM

## 2017-10-05 DIAGNOSIS — Z79899 Other long term (current) drug therapy: Secondary | ICD-10-CM | POA: Insufficient documentation

## 2017-10-05 DIAGNOSIS — O9A211 Injury, poisoning and certain other consequences of external causes complicating pregnancy, first trimester: Secondary | ICD-10-CM | POA: Diagnosis present

## 2017-10-05 DIAGNOSIS — S022XXA Fracture of nasal bones, initial encounter for closed fracture: Secondary | ICD-10-CM | POA: Diagnosis not present

## 2017-10-05 DIAGNOSIS — Z3490 Encounter for supervision of normal pregnancy, unspecified, unspecified trimester: Secondary | ICD-10-CM

## 2017-10-05 LAB — CBC WITH DIFFERENTIAL/PLATELET
Basophils Absolute: 0 10*3/uL (ref 0.0–0.1)
Basophils Relative: 0 %
Eosinophils Absolute: 0.1 10*3/uL (ref 0.0–0.7)
Eosinophils Relative: 1 %
HCT: 39.9 % (ref 36.0–46.0)
HEMOGLOBIN: 13.4 g/dL (ref 12.0–15.0)
LYMPHS ABS: 3.5 10*3/uL (ref 0.7–4.0)
LYMPHS PCT: 29 %
MCH: 29.8 pg (ref 26.0–34.0)
MCHC: 33.6 g/dL (ref 30.0–36.0)
MCV: 88.7 fL (ref 78.0–100.0)
Monocytes Absolute: 0.9 10*3/uL (ref 0.1–1.0)
Monocytes Relative: 7 %
NEUTROS PCT: 63 %
Neutro Abs: 7.8 10*3/uL — ABNORMAL HIGH (ref 1.7–7.7)
Platelets: 217 10*3/uL (ref 150–400)
RBC: 4.5 MIL/uL (ref 3.87–5.11)
RDW: 12.7 % (ref 11.5–15.5)
WBC: 12.3 10*3/uL — ABNORMAL HIGH (ref 4.0–10.5)

## 2017-10-05 LAB — BASIC METABOLIC PANEL
ANION GAP: 7 (ref 5–15)
BUN: 9 mg/dL (ref 6–20)
CHLORIDE: 101 mmol/L (ref 101–111)
CO2: 25 mmol/L (ref 22–32)
Calcium: 9.3 mg/dL (ref 8.9–10.3)
Creatinine, Ser: 0.67 mg/dL (ref 0.44–1.00)
GFR calc non Af Amer: >60 mL/min (ref >60)
Glucose, Bld: 81 mg/dL (ref 65–99)
POTASSIUM: 3.6 mmol/L (ref 3.5–5.1)
SODIUM: 133 mmol/L — AB (ref 135–145)

## 2017-10-05 LAB — HCG, QUANTITATIVE, PREGNANCY: hCG, Beta Chain, Quant, S: 42545 m[IU]/mL — ABNORMAL HIGH (ref <5)

## 2017-10-05 MED ORDER — PRENATAL VITAMIN 27-0.8 MG PO TABS: 1.0000 | ORAL_TABLET | Freq: Every day | ORAL | 3 refills | Status: DC
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
## 2017-10-06 ENCOUNTER — Encounter: Payer: Self-pay | Admitting: Obstetrics and Gynecology

## 2017-10-06 DIAGNOSIS — T7491XA Unspecified adult maltreatment, confirmed, initial encounter: Secondary | ICD-10-CM | POA: Insufficient documentation

## 2017-10-08 ENCOUNTER — Other Ambulatory Visit: Payer: Self-pay | Admitting: Obstetrics & Gynecology

## 2017-10-13 ENCOUNTER — Encounter: Payer: Self-pay | Admitting: Adult Health

## 2017-10-13 ENCOUNTER — Ambulatory Visit: Payer: Medicaid Other | Admitting: Adult Health

## 2017-10-13 ENCOUNTER — Telehealth: Payer: Self-pay | Admitting: Adult Health

## 2017-10-13 VITALS — BP 103/57 | HR 78 | Ht 65.0 in | Wt 161.5 lb

## 2017-10-13 DIAGNOSIS — Z3042 Encounter for surveillance of injectable contraceptive: Secondary | ICD-10-CM | POA: Insufficient documentation

## 2017-10-13 DIAGNOSIS — O3680X0 Pregnancy with inconclusive fetal viability, not applicable or unspecified: Secondary | ICD-10-CM

## 2017-10-13 DIAGNOSIS — N926 Irregular menstruation, unspecified: Secondary | ICD-10-CM

## 2017-10-13 DIAGNOSIS — Z975 Presence of (intrauterine) contraceptive device: Secondary | ICD-10-CM

## 2017-10-13 DIAGNOSIS — Z3201 Encounter for pregnancy test, result positive: Secondary | ICD-10-CM | POA: Diagnosis not present

## 2017-10-13 DIAGNOSIS — Z8659 Personal history of other mental and behavioral disorders: Secondary | ICD-10-CM | POA: Diagnosis not present

## 2017-10-13 DIAGNOSIS — Z3A01 Less than 8 weeks gestation of pregnancy: Secondary | ICD-10-CM | POA: Insufficient documentation

## 2017-10-13 LAB — POCT URINE PREGNANCY: Preg Test, Ur: POSITIVE — AB

## 2017-10-13 NOTE — Patient Instructions (Signed)
First Trimester of Pregnancy The first trimester of pregnancy is from week 1 until the end of week 13 (months 1 through 3). A week after a sperm fertilizes an egg, the egg will implant on the wall of the uterus. This embryo will begin to develop into a baby. Genes from you and your partner will form the baby. The female genes will determine whether the baby will be a boy or a girl. At 6-8 weeks, the eyes and face will be formed, and the heartbeat can be seen on ultrasound. At the end of 12 weeks, all the baby's organs will be formed. Now that you are pregnant, you will want to do everything you can to have a healthy baby. Two of the most important things are to get good prenatal care and to follow your health care provider's instructions. Prenatal care is all the medical care you receive before the baby's birth. This care will help prevent, find, and treat any problems during the pregnancy and childbirth. Body changes during your first trimester Your body goes through many changes during pregnancy. The changes vary from woman to woman.  You may gain or lose a couple of pounds at first.  You may feel sick to your stomach (nauseous) and you may throw up (vomit). If the vomiting is uncontrollable, call your health care provider.  You may tire easily.  You may develop headaches that can be relieved by medicines. All medicines should be approved by your health care provider.  You may urinate more often. Painful urination may mean you have a bladder infection.  You may develop heartburn as a result of your pregnancy.  You may develop constipation because certain hormones are causing the muscles that push stool through your intestines to slow down.  You may develop hemorrhoids or swollen veins (varicose veins).  Your breasts may begin to grow larger and become tender. Your nipples may stick out more, and the tissue that surrounds them (areola) may become darker.  Your gums may bleed and may be  sensitive to brushing and flossing.  Dark spots or blotches (chloasma, mask of pregnancy) may develop on your face. This will likely fade after the baby is born.  Your menstrual periods will stop.  You may have a loss of appetite.  You may develop cravings for certain kinds of food.  You may have changes in your emotions from day to day, such as being excited to be pregnant or being concerned that something may go wrong with the pregnancy and baby.  You may have more vivid and strange dreams.  You may have changes in your hair. These can include thickening of your hair, rapid growth, and changes in texture. Some women also have hair loss during or after pregnancy, or hair that feels dry or thin. Your hair will most likely return to normal after your baby is born.  What to expect at prenatal visits During a routine prenatal visit:  You will be weighed to make sure you and the baby are growing normally.  Your blood pressure will be taken.  Your abdomen will be measured to track your baby's growth.  The fetal heartbeat will be listened to between weeks 10 and 14 of your pregnancy.  Test results from any previous visits will be discussed.  Your health care provider may ask you:  How you are feeling.  If you are feeling the baby move.  If you have had any abnormal symptoms, such as leaking fluid, bleeding, severe headaches,   or abdominal cramping.  If you are using any tobacco products, including cigarettes, chewing tobacco, and electronic cigarettes.  If you have any questions.  Other tests that may be performed during your first trimester include:  Blood tests to find your blood type and to check for the presence of any previous infections. The tests will also be used to check for low iron levels (anemia) and protein on red blood cells (Rh antibodies). Depending on your risk factors, or if you previously had diabetes during pregnancy, you may have tests to check for high blood  sugar that affects pregnant women (gestational diabetes).  Urine tests to check for infections, diabetes, or protein in the urine.  An ultrasound to confirm the proper growth and development of the baby.  Fetal screens for spinal cord problems (spina bifida) and Down syndrome.  HIV (human immunodeficiency virus) testing. Routine prenatal testing includes screening for HIV, unless you choose not to have this test.  You may need other tests to make sure you and the baby are doing well.  Follow these instructions at home: Medicines  Follow your health care provider's instructions regarding medicine use. Specific medicines may be either safe or unsafe to take during pregnancy.  Take a prenatal vitamin that contains at least 600 micrograms (mcg) of folic acid.  If you develop constipation, try taking a stool softener if your health care provider approves. Eating and drinking  Eat a balanced diet that includes fresh fruits and vegetables, whole grains, good sources of protein such as meat, eggs, or tofu, and low-fat dairy. Your health care provider will help you determine the amount of weight gain that is right for you.  Avoid raw meat and uncooked cheese. These carry germs that can cause birth defects in the baby.  Eating four or five small meals rather than three large meals a day may help relieve nausea and vomiting. If you start to feel nauseous, eating a few soda crackers can be helpful. Drinking liquids between meals, instead of during meals, also seems to help ease nausea and vomiting.  Limit foods that are high in fat and processed sugars, such as fried and sweet foods.  To prevent constipation: ? Eat foods that are high in fiber, such as fresh fruits and vegetables, whole grains, and beans. ? Drink enough fluid to keep your urine clear or pale yellow. Activity  Exercise only as directed by your health care provider. Most women can continue their usual exercise routine during  pregnancy. Try to exercise for 30 minutes at least 5 days a week. Exercising will help you: ? Control your weight. ? Stay in shape. ? Be prepared for labor and delivery.  Experiencing pain or cramping in the lower abdomen or lower back is a good sign that you should stop exercising. Check with your health care provider before continuing with normal exercises.  Try to avoid standing for long periods of time. Move your legs often if you must stand in one place for a long time.  Avoid heavy lifting.  Wear low-heeled shoes and practice good posture.  You may continue to have sex unless your health care provider tells you not to. Relieving pain and discomfort  Wear a good support bra to relieve breast tenderness.  Take warm sitz baths to soothe any pain or discomfort caused by hemorrhoids. Use hemorrhoid cream if your health care provider approves.  Rest with your legs elevated if you have leg cramps or low back pain.  If you develop   varicose veins in your legs, wear support hose. Elevate your feet for 15 minutes, 3-4 times a day. Limit salt in your diet. Prenatal care  Schedule your prenatal visits by the twelfth week of pregnancy. They are usually scheduled monthly at first, then more often in the last 2 months before delivery.  Write down your questions. Take them to your prenatal visits.  Keep all your prenatal visits as told by your health care provider. This is important. Safety  Wear your seat belt at all times when driving.  Make a list of emergency phone numbers, including numbers for family, friends, the hospital, and police and fire departments. General instructions  Ask your health care provider for a referral to a local prenatal education class. Begin classes no later than the beginning of month 6 of your pregnancy.  Ask for help if you have counseling or nutritional needs during pregnancy. Your health care provider can offer advice or refer you to specialists for help  with various needs.  Do not use hot tubs, steam rooms, or saunas.  Do not douche or use tampons or scented sanitary pads.  Do not cross your legs for long periods of time.  Avoid cat litter boxes and soil used by cats. These carry germs that can cause birth defects in the baby and possibly loss of the fetus by miscarriage or stillbirth.  Avoid all smoking, herbs, alcohol, and medicines not prescribed by your health care provider. Chemicals in these products affect the formation and growth of the baby.  Do not use any products that contain nicotine or tobacco, such as cigarettes and e-cigarettes. If you need help quitting, ask your health care provider. You may receive counseling support and other resources to help you quit.  Schedule a dentist appointment. At home, brush your teeth with a soft toothbrush and be gentle when you floss. Contact a health care provider if:  You have dizziness.  You have mild pelvic cramps, pelvic pressure, or nagging pain in the abdominal area.  You have persistent nausea, vomiting, or diarrhea.  You have a bad smelling vaginal discharge.  You have pain when you urinate.  You notice increased swelling in your face, hands, legs, or ankles.  You are exposed to fifth disease or chickenpox.  You are exposed to German measles (rubella) and have never had it. Get help right away if:  You have a fever.  You are leaking fluid from your vagina.  You have spotting or bleeding from your vagina.  You have severe abdominal cramping or pain.  You have rapid weight gain or loss.  You vomit blood or material that looks like coffee grounds.  You develop a severe headache.  You have shortness of breath.  You have any kind of trauma, such as from a fall or a car accident. Summary  The first trimester of pregnancy is from week 1 until the end of week 13 (months 1 through 3).  Your body goes through many changes during pregnancy. The changes vary from  woman to woman.  You will have routine prenatal visits. During those visits, your health care provider will examine you, discuss any test results you may have, and talk with you about how you are feeling. This information is not intended to replace advice given to you by your health care provider. Make sure you discuss any questions you have with your health care provider. Document Released: 03/31/2001 Document Revised: 03/18/2016 Document Reviewed: 03/18/2016 Elsevier Interactive Patient Education  2018 [employer]vier   Inc.  

## 2017-10-13 NOTE — Telephone Encounter (Signed)
No VM, did to talk with her about ER visit.
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
## 2017-10-18 ENCOUNTER — Encounter: Payer: Self-pay | Admitting: Adult Health

## 2017-10-18 ENCOUNTER — Ambulatory Visit (INDEPENDENT_AMBULATORY_CARE_PROVIDER_SITE_OTHER): Payer: Medicaid Other | Admitting: Adult Health

## 2017-10-18 VITALS — BP 86/54 | HR 65 | Ht 65.0 in | Wt 162.0 lb

## 2017-10-18 DIAGNOSIS — Z3046 Encounter for surveillance of implantable subdermal contraceptive: Secondary | ICD-10-CM | POA: Diagnosis not present

## 2017-10-18 NOTE — Progress Notes (Signed)
  Subjective:     Patient ID: Krista HawkingAntica L Monroe, female   DOB: February 07, 1986, 32 y.o.   MRN: [id number]  HPI Krista Monroe is a 32 year old black female in for nexplanon removal, it is past due to be removed and she is now pregnant.   Review of Systems For nexplanon removal Reviewed past medical,surgical, social and family history. Reviewed medications and allergies.     Objective:   Physical Exam BP (!) 86/54 (BP Location: Left Arm, Patient Position: Sitting, Cuff Size: Small)   Pulse 65   Ht 5\' 5"  (1.651 m)   Wt 162 lb (73.5 kg)   LMP 08/19/2017   BMI 26.96 kg/m   Consent signed, time out called.Left arm cleansed with betadine, and injected with 1.5 cc !% lidocaine and waited til numb.Under sterile technique a #11 blade was used to make small vertical incision, and a curved forceps was used to easily remove rod. Steri strips applied. Pressure dressing applied.    Assessment:     1. Encounter for Nexplanon removal       Plan:     Use condoms, keep clean and dry x 24 hours, no heavy lifting, keep steri strips on x 72 hours, Keep pressure dressing on x 24 hours. Follow up prn problems.  Has US appt in 2 weeks

## 2017-10-18 NOTE — Patient Instructions (Signed)
Use condoms, keep clean and dry x 24 hours, no heavy lifting, keep steri strips on x 72 hours, Keep pressure dressing on x 24 hours. Follow up prn problems.  

## 2017-10-25 ENCOUNTER — Emergency Department (HOSPITAL_COMMUNITY)
Admission: EM | Admit: 2017-10-25 | Discharge: 2017-10-25 | Disposition: A | Discharge status: Home / Self Care | Payer: Medicaid Other | Attending: Emergency Medicine | Admitting: Emergency Medicine

## 2017-10-25 ENCOUNTER — Other Ambulatory Visit: Payer: Self-pay

## 2017-10-25 ENCOUNTER — Encounter (HOSPITAL_COMMUNITY): Payer: Self-pay | Admitting: *Deleted

## 2017-10-25 DIAGNOSIS — R55 Syncope and collapse: Secondary | ICD-10-CM | POA: Insufficient documentation

## 2017-10-25 DIAGNOSIS — Z87891 Personal history of nicotine dependence: Secondary | ICD-10-CM | POA: Insufficient documentation

## 2017-10-25 DIAGNOSIS — O9989 Other specified diseases and conditions complicating pregnancy, childbirth and the puerperium: Secondary | ICD-10-CM | POA: Insufficient documentation

## 2017-10-25 DIAGNOSIS — Z3A08 8 weeks gestation of pregnancy: Secondary | ICD-10-CM | POA: Insufficient documentation

## 2017-10-25 DIAGNOSIS — R404 Transient alteration of awareness: Secondary | ICD-10-CM | POA: Diagnosis not present

## 2017-10-25 LAB — CBC WITH DIFFERENTIAL/PLATELET
BASOS PCT: 0 %
Basophils Absolute: 0 10*3/uL (ref 0.0–0.1)
EOS ABS: 0.1 10*3/uL (ref 0.0–0.7)
Eosinophils Relative: 1 %
HCT: 35.1 % — ABNORMAL LOW (ref 36.0–46.0)
HEMOGLOBIN: 11.6 g/dL — AB (ref 12.0–15.0)
Lymphocytes Relative: 36 %
Lymphs Abs: 3.8 10*3/uL (ref 0.7–4.0)
MCH: 29.4 pg (ref 26.0–34.0)
MCHC: 33 g/dL (ref 30.0–36.0)
MCV: 88.9 fL (ref 78.0–100.0)
MONOS PCT: 7 %
Monocytes Absolute: 0.8 10*3/uL (ref 0.1–1.0)
NEUTROS PCT: 56 %
Neutro Abs: 5.9 10*3/uL (ref 1.7–7.7)
PLATELETS: 198 10*3/uL (ref 150–400)
RBC: 3.95 MIL/uL (ref 3.87–5.11)
RDW: 13 % (ref 11.5–15.5)
WBC: 10.6 10*3/uL — ABNORMAL HIGH (ref 4.0–10.5)

## 2017-10-25 LAB — BASIC METABOLIC PANEL
Anion gap: 7 (ref 5–15)
BUN: 9 mg/dL (ref 6–20)
CALCIUM: 8.6 mg/dL — AB (ref 8.9–10.3)
CHLORIDE: 105 mmol/L (ref 98–111)
CO2: 24 mmol/L (ref 22–32)
CREATININE: 0.83 mg/dL (ref 0.44–1.00)
GFR calc Af Amer: >60 mL/min (ref >60)
Glucose, Bld: 98 mg/dL (ref 70–99)
Potassium: 3.6 mmol/L (ref 3.5–5.1)
SODIUM: 136 mmol/L (ref 135–145)

## 2017-10-25 MED ORDER — DEXTROSE 5 % AND 0.45 % NACL IV BOLUS
1000.0000 mL | Freq: Once | INTRAVENOUS | Status: AC
  Administered 2017-10-25: 1000 mL via INTRAVENOUS
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
## 2017-10-25 NOTE — Discharge Instructions (Addendum)
As discussed, your evaluation today has been largely reassuring.  But, it is important that you monitor your condition carefully, and do not hesitate to return to the ED if you develop new, or concerning changes in your condition. ? ?Otherwise, please follow-up with your physician for appropriate ongoing care. ? ?

## 2017-10-28 ENCOUNTER — Ambulatory Visit (INDEPENDENT_AMBULATORY_CARE_PROVIDER_SITE_OTHER): Payer: Medicaid Other

## 2017-10-28 ENCOUNTER — Other Ambulatory Visit: Payer: Self-pay | Admitting: Adult Health

## 2017-10-28 ENCOUNTER — Other Ambulatory Visit: Payer: Self-pay | Admitting: Advanced Practice Midwife

## 2017-10-28 ENCOUNTER — Ambulatory Visit (INDEPENDENT_AMBULATORY_CARE_PROVIDER_SITE_OTHER): Payer: Medicaid Other | Admitting: Advanced Practice Midwife

## 2017-10-28 DIAGNOSIS — O021 Missed abortion: Secondary | ICD-10-CM | POA: Diagnosis not present

## 2017-10-28 DIAGNOSIS — Z3A16 16 weeks gestation of pregnancy: Secondary | ICD-10-CM

## 2017-10-28 DIAGNOSIS — O3680X0 Pregnancy with inconclusive fetal viability, not applicable or unspecified: Secondary | ICD-10-CM
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
## 2017-10-28 NOTE — Progress Notes (Signed)
error 

## 2017-10-28 NOTE — Patient Instructions (Signed)
FACTS YOU SHOULD KNOW  WHAT IS AN EARLY PREGNANCY FAILURE? Once the egg is fertilized with the sperm and begins to develop, it attaches to the lining of the uterus. This early pregnancy tissue may not develop into an embryo (the beginning stage of a baby). Sometimes an embryo does develop but does not continue to grow. These problems can be seen on ultrasound.   MANAGEMNT OF EARLY PREGNANCY FAILURE: About 4 out of 100 (0.25%) women will have a pregnancy loss in her lifetime.  One in five pregnancies is found to be an early pregnancy failure.  There are 3 ways to care for an early pregnancy failure:    (1) Medicine, (2) Waiting for you to pass the pregnancy on your own. Sometimes, surgery is necessary.   The decision as to how to proceed after being diagnosed with and early pregnancy failure is an individual one.  The decision can be made only after appropriate counseling.  You need to weigh the pros and cons of the choices. Then you can make the choice that works for you. Medicine (CYTOTEC) . The complete procedure may take days to weeks, usually a few days . No Surgery . Bleeding may be heavy at times . There may be drug side effects . Patient has more control Waiting . You may choose to wait, in which case your own body may complete the passing of the abnormal early pregnancy on its own in about 2-4 weeks . Your bleeding may be heavy at times . There is a small possibility that you may need surgery if the bleeding is too much or not all of the pregnancy has passed.  SURGERY (D&C) . Procedure over in 1 day . Requires being put to sleep . Bleeding may be light . Possible problems during surgery, including injury to womb(uterus) . Care provider has more control  CYTOTEC MANAGEMENT Prostaglandins (cytotec) are the most widely used drug for this purpose. They cause the uterus to cramp and contract. You will place the medicine yourself inside your vagina in the privacy of your home. Empting  of the uterus should occur within 3 days but the process may continue for several weeks. The bleeding may seem heavy at times. POSSIBLE SIDE EFFECTS FROM CYTOTEC . Nausea   Vomiting . Diarrhea Fever . Chills  Hot Flashes Side effects  from the process of the early pregnancy failure include: . Cramping  Bleeding . Headaches  Dizziness RISKS: This is a low risk procedure. Less than 1 in 100 women has a complication. An incomplete passage of the early pregnancy may occur. Also, Hemorrhage (heavy bleeding) could happen.  Rarely the pregnancy will not be passed completely. Excessively heavy bleeding may occur.  Your doctor may need to perform surgery to empty the uterus (D&E). Afterwards: Everybody will feel differently after the early pregnancy completion. You may have soreness or cramps for a day or two. You may have soreness or cramps for day or two.  You may have light bleeding for up to 2 weeks. You may be as active as you feel like being. If you have any of the following problems you may call [facility] Admissions [facility] at [phone]. . If you have pain that does not get better  with pain medication . Bleeding that soaks through 2 thick full-sized sanitary pads in an hour . Cramps that last longer than 2 days . Foul smelling discharge . Fever above 100.4 degrees F Even if you do not have any of these symptoms,   you should have a follow-up exam to make sure you are healing properly. This appointment will be made for you before you leave the hospital. Your next normal period will start again in 4-6 week after the loss. You can get pregnant soon after the loss, so use birth control right away. Finally: Make sure all your questions are answered before during and after any procedure. Follow up with medical care and family planning methods. .Krista Monroe

## 2017-10-28 NOTE — Progress Notes (Signed)
US 6 + 1 wks fetal pole,no fht,crl 4.49 mm,3.6 cm irregular GS,normal right ovary,complex left ovarian cyst 3.4 x 3.2 x 3.1 cm,Fran reviewed images during ultrasound

## 2017-10-29 ENCOUNTER — Other Ambulatory Visit: Payer: Self-pay | Admitting: *Deleted

## 2017-10-29 ENCOUNTER — Telehealth: Payer: Self-pay | Admitting: Obstetrics & Gynecology

## 2017-10-29 LAB — ABO/RH: Rh Factor: POSITIVE

## 2017-10-29 MED ORDER — MISOPROSTOL 200 MCG PO TABS: 800.0000 ug | ORAL_TABLET | Freq: Once | ORAL | 1 refills | Status: AC

## 2017-10-29 NOTE — Telephone Encounter (Signed)
Patient states she has decided to proceed with the cytotec.  Instructions given to patient on how to take along with repeat dosage if no bleeding or cramping occurs.  Pt verbalized understanding and will call when bleeding occurs to set up appt.

## 2017-10-29 NOTE — Telephone Encounter (Signed)
Cancel message °

## 2017-12-31 ENCOUNTER — Encounter: Payer: Self-pay | Admitting: Obstetrics and Gynecology

## 2017-12-31 ENCOUNTER — Encounter (INDEPENDENT_AMBULATORY_CARE_PROVIDER_SITE_OTHER): Payer: Self-pay

## 2017-12-31 ENCOUNTER — Ambulatory Visit (INDEPENDENT_AMBULATORY_CARE_PROVIDER_SITE_OTHER): Payer: Medicaid Other | Admitting: Obstetrics and Gynecology

## 2017-12-31 VITALS — BP 117/76 | HR 82 | Wt 166.0 lb

## 2017-12-31 DIAGNOSIS — Z3202 Encounter for pregnancy test, result negative: Secondary | ICD-10-CM

## 2017-12-31 DIAGNOSIS — Z113 Encounter for screening for infections with a predominantly sexual mode of transmission: Secondary | ICD-10-CM | POA: Diagnosis not present

## 2017-12-31 DIAGNOSIS — A5901 Trichomonal vulvovaginitis: Secondary | ICD-10-CM | POA: Diagnosis not present

## 2017-12-31 DIAGNOSIS — O039 Complete or unspecified spontaneous abortion without complication: Secondary | ICD-10-CM | POA: Diagnosis not present

## 2017-12-31 LAB — POCT URINE PREGNANCY: Preg Test, Ur: NEGATIVE

## 2017-12-31 MED ORDER — METRONIDAZOLE 500 MG PO TABS: 500.0000 mg | ORAL_TABLET | Freq: Two times a day (BID) | ORAL | 1 refills | Status: DC
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
## 2017-12-31 NOTE — Patient Instructions (Signed)

## 2018-01-04 LAB — GC/CHLAMYDIA PROBE AMP
Chlamydia trachomatis, NAA: NEGATIVE
NEISSERIA GONORRHOEAE BY PCR: NEGATIVE

## 2018-01-13 DIAGNOSIS — Z23 Encounter for immunization: Secondary | ICD-10-CM | POA: Diagnosis not present

## 2018-01-17 DIAGNOSIS — E663 Overweight: Secondary | ICD-10-CM | POA: Diagnosis not present

## 2018-01-17 DIAGNOSIS — Z0000 Encounter for general adult medical examination without abnormal findings: Secondary | ICD-10-CM | POA: Diagnosis not present

## 2018-01-17 DIAGNOSIS — Z6827 Body mass index (BMI) 27.0-27.9, adult: Secondary | ICD-10-CM | POA: Diagnosis not present

## 2018-01-17 DIAGNOSIS — Z1389 Encounter for screening for other disorder: Secondary | ICD-10-CM | POA: Diagnosis not present

## 2018-01-26 ENCOUNTER — Encounter: Payer: Self-pay | Admitting: Obstetrics and Gynecology

## 2018-01-26 ENCOUNTER — Ambulatory Visit: Payer: Medicaid Other | Admitting: Obstetrics and Gynecology

## 2018-01-26 ENCOUNTER — Other Ambulatory Visit: Payer: Self-pay

## 2018-01-26 ENCOUNTER — Telehealth: Payer: Self-pay | Admitting: *Deleted

## 2018-01-26 ENCOUNTER — Telehealth: Payer: Self-pay | Admitting: Obstetrics & Gynecology

## 2018-01-26 VITALS — BP 134/83 | HR 85 | Ht 65.0 in | Wt 166.0 lb

## 2018-01-26 DIAGNOSIS — A5901 Trichomonal vulvovaginitis: Secondary | ICD-10-CM | POA: Diagnosis not present

## 2018-01-26 DIAGNOSIS — Z8619 Personal history of other infectious and parasitic diseases: Secondary | ICD-10-CM

## 2018-01-26 DIAGNOSIS — Z8659 Personal history of other mental and behavioral disorders: Secondary | ICD-10-CM | POA: Diagnosis not present

## 2018-01-26 LAB — POCT WET PREP (WET MOUNT)
Trichomonas Wet Prep HPF POC: ABSENT
WBC, Wet Prep HPF POC: NEGATIVE

## 2018-01-26 MED ORDER — MEDROXYPROGESTERONE ACETATE 150 MG/ML IM SUSP: 150.0000 mg | INTRAMUSCULAR | 3 refills | Status: DC

## 2018-01-26 MED ORDER — CITALOPRAM HYDROBROMIDE 40 MG PO TABS: 40.0000 mg | ORAL_TABLET | Freq: Every day | ORAL | 11 refills | Status: DC

## 2018-01-26 NOTE — Telephone Encounter (Signed)
Increased to 40mg.

## 2018-01-26 NOTE — Telephone Encounter (Signed)
Pt informed that celexa dosage had been increased to 40mg . Advised that new prescription was sent to pharmacy. Advised to call with any concerns. Pt verbalized understanding.

## 2018-01-26 NOTE — Telephone Encounter (Signed)
Pt is asking if her Celexa dosage can be increased. She states that the current dosage is working but she is dealing with a lot more stress at the present. Advised that I would send her request to Dr Despina Hidden and let her know his recommendations. Pt verbalized understanding.
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
## 2018-01-26 NOTE — Telephone Encounter (Signed)
Meds ordered this encounter  Medications  . citalopram (CELEXA) 40 MG tablet    Sig: Take 1 tablet (40 mg total) by mouth daily.    Dispense:  30 tablet    Refill:  11

## 2018-01-26 NOTE — Addendum Note (Signed)
Addended by: Tish Frederickson A on: 01/26/2018 11:47 AM   Modules accepted: Orders

## 2018-02-21 ENCOUNTER — Encounter: Payer: Medicaid Other | Admitting: Adult Health

## 2018-02-22 ENCOUNTER — Ambulatory Visit: Payer: Medicaid Other

## 2018-02-22 NOTE — Progress Notes (Signed)
This encounter was created in error - please disregard.

## 2018-02-23 ENCOUNTER — Ambulatory Visit: Payer: Medicaid Other

## 2018-03-30 IMAGING — DX DG CHEST 2V
2 series · 2 of 2 positions shown · non-contrast
Comparison: None.

CLINICAL DATA: Cough and dyspnea.

EXAM:
CHEST  2 VIEW

[chest pa]
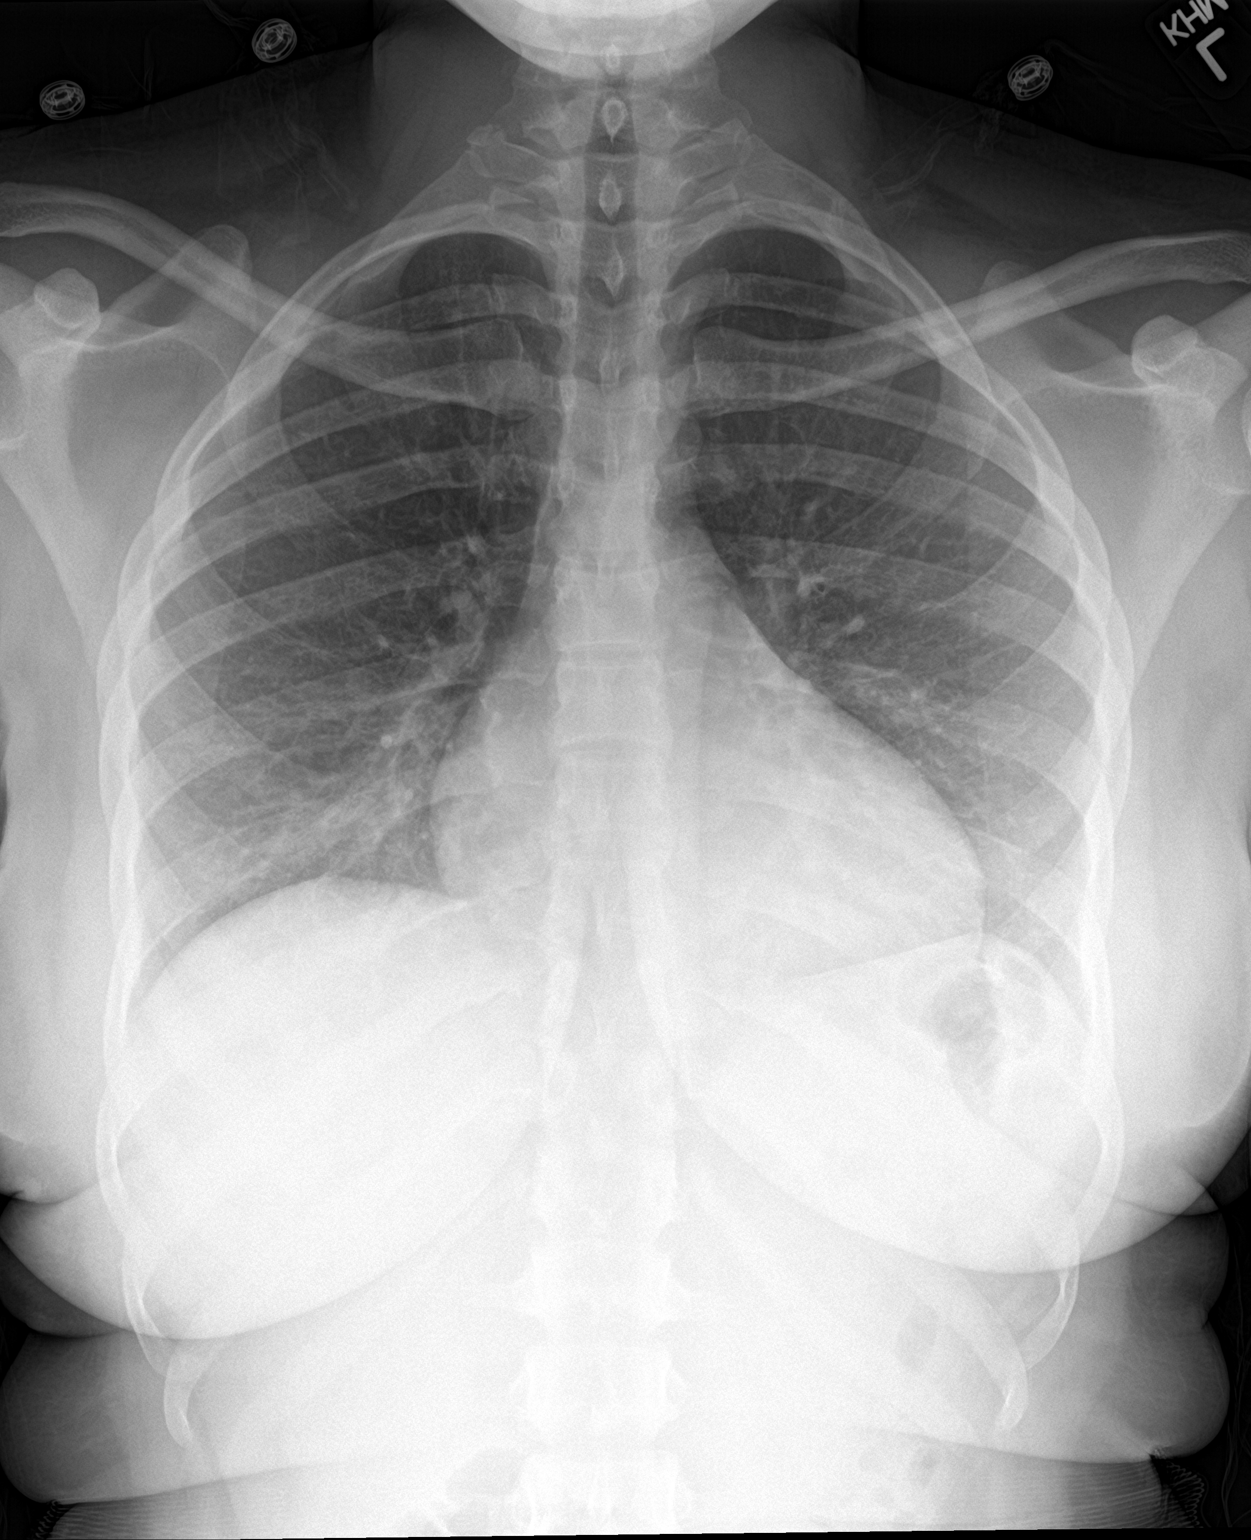

[chest lat]
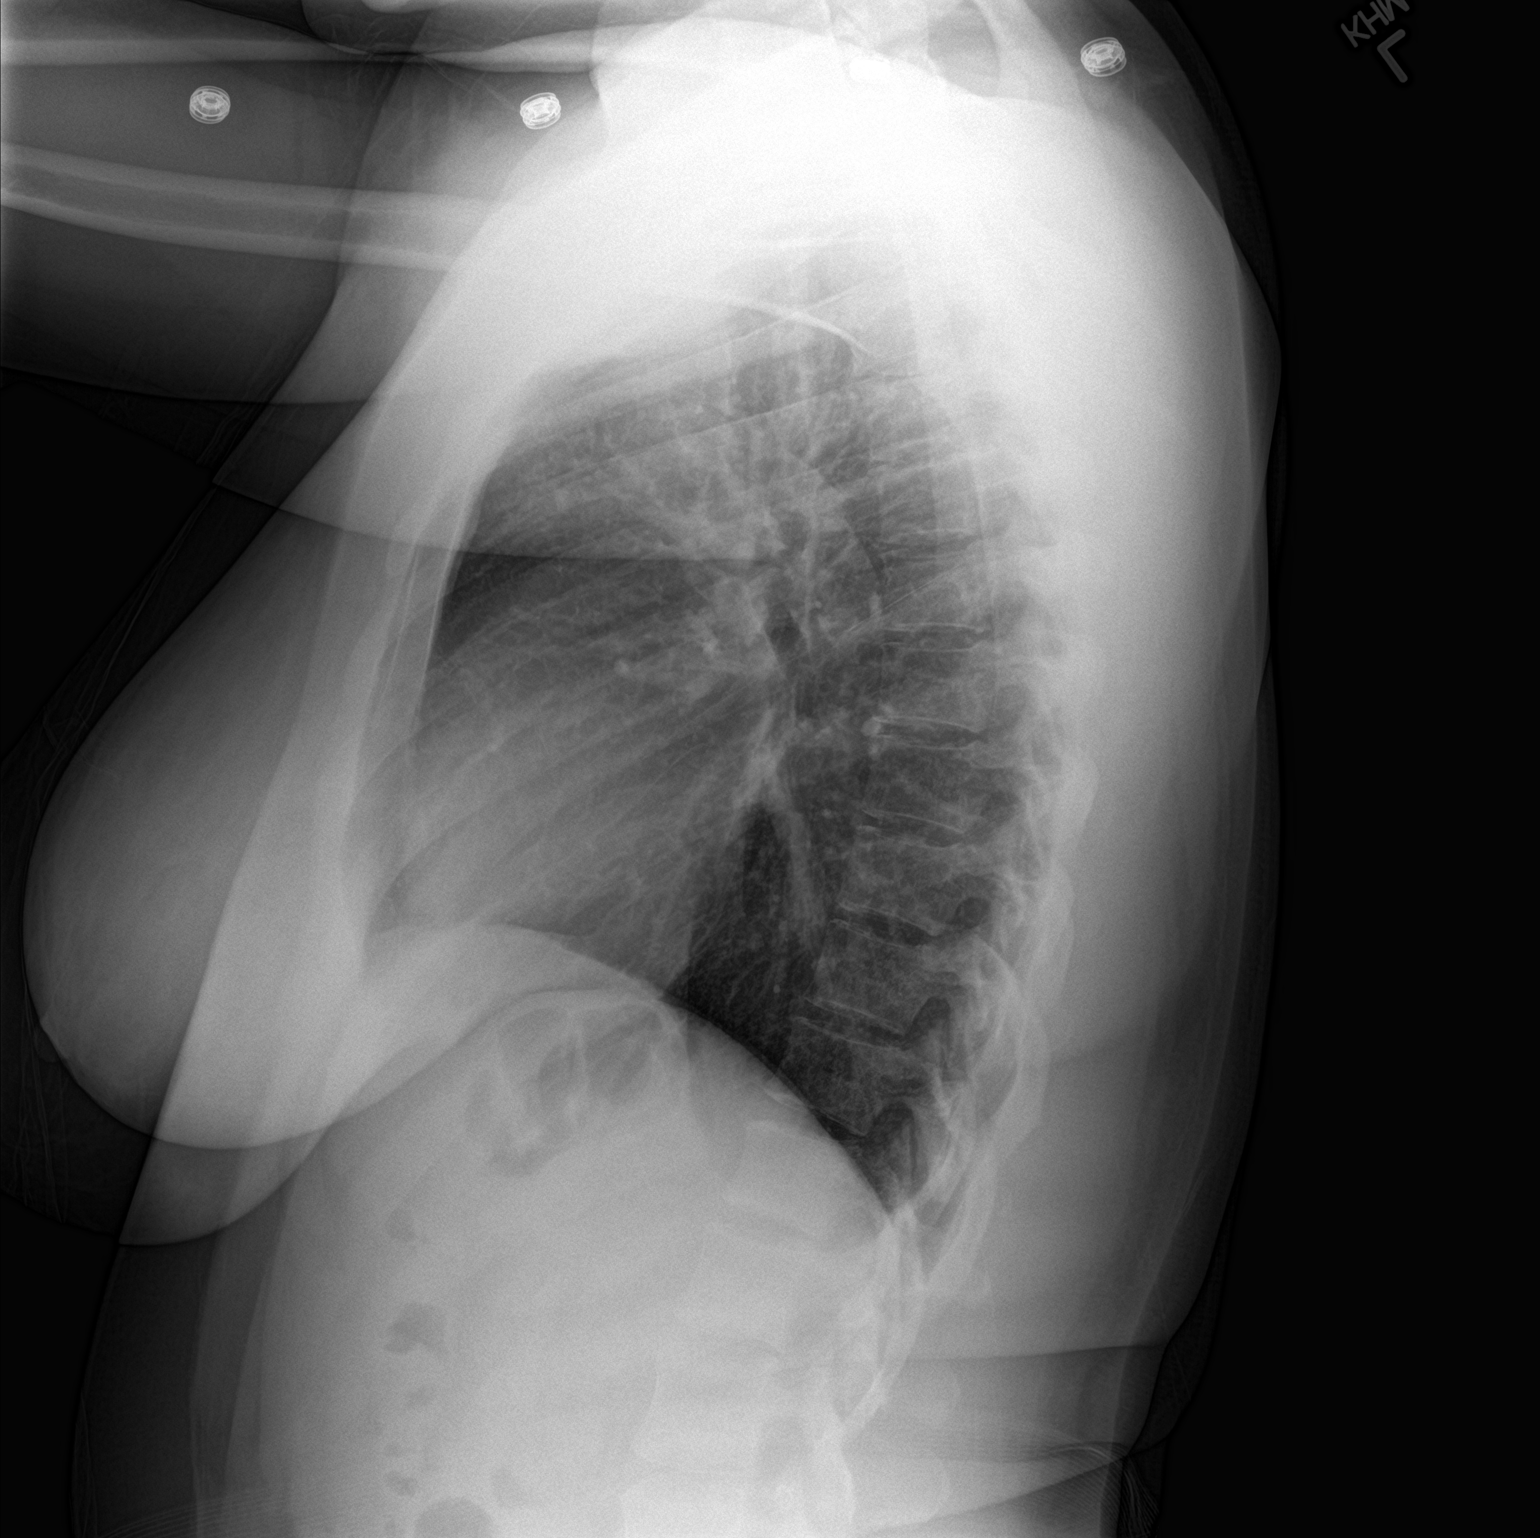

[2 of 2 positions shown; findings below may reference images not displayed]

FINDINGS: Normal heart size. Normal mediastinal contour. No pneumothorax. No
pleural effusion. Lungs appear clear, with no acute consolidative
airspace disease and no pulmonary edema.
IMPRESSION: No active cardiopulmonary disease.

## 2018-06-08 ENCOUNTER — Encounter: Payer: Medicaid Other | Admitting: Women's Health

## 2018-06-08 ENCOUNTER — Encounter: Payer: Self-pay | Admitting: Women's Health

## 2018-06-08 ENCOUNTER — Ambulatory Visit: Payer: Medicaid Other | Admitting: *Deleted

## 2018-06-08 ENCOUNTER — Encounter: Payer: Self-pay | Admitting: *Deleted

## 2018-06-08 VITALS — BP 113/64 | HR 73 | Ht 65.0 in | Wt 165.5 lb

## 2018-06-08 DIAGNOSIS — Z3042 Encounter for surveillance of injectable contraceptive: Secondary | ICD-10-CM

## 2018-06-08 DIAGNOSIS — Z308 Encounter for other contraceptive management: Secondary | ICD-10-CM

## 2018-06-08 LAB — POCT URINE PREGNANCY: Preg Test, Ur: NEGATIVE

## 2018-06-08 MED ORDER — MEDROXYPROGESTERONE ACETATE 150 MG/ML IM SUSP
150.0000 mg | Freq: Once | INTRAMUSCULAR | Status: AC
  Administered 2018-06-08: 150 mg via INTRAMUSCULAR

## 2018-06-08 NOTE — Progress Notes (Signed)
Pt here for Depo. Pt received Depo in right deltoid. Pt tolerated shot well. Return in 12 weeks for next shot. JSY 

## 2018-06-09 NOTE — Progress Notes (Signed)
Not seen by provider, already saw JVF in Oct/rx'd depo, has with her today, just needs nurse visit for injection.  Cheral Marker, CNM, [facility] 06/08/2018 This encounter was created in error - please disregard.
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
## 2018-08-31 ENCOUNTER — Ambulatory Visit: Payer: Self-pay

## 2018-09-01 ENCOUNTER — Ambulatory Visit: Payer: Medicaid Other

## 2018-09-19 ENCOUNTER — Ambulatory Visit: Payer: Medicaid Other

## 2018-10-28 ENCOUNTER — Telehealth: Payer: Self-pay | Admitting: *Deleted

## 2018-10-28 NOTE — Telephone Encounter (Signed)
Patient called wanting to be seen for std check and birth control. Please advise [phone]

## 2018-10-28 NOTE — Telephone Encounter (Signed)
I called patient no answer no voicemail  

## 2018-11-15 ENCOUNTER — Encounter: Payer: Self-pay | Admitting: Adult Health

## 2018-11-15 ENCOUNTER — Other Ambulatory Visit: Payer: Self-pay

## 2018-11-15 ENCOUNTER — Ambulatory Visit (INDEPENDENT_AMBULATORY_CARE_PROVIDER_SITE_OTHER): Payer: Medicaid Other | Admitting: Adult Health

## 2018-11-15 VITALS — BP 121/76 | HR 70 | Ht 65.0 in | Wt 172.5 lb

## 2018-11-15 DIAGNOSIS — Z3042 Encounter for surveillance of injectable contraceptive: Secondary | ICD-10-CM | POA: Diagnosis not present

## 2018-11-15 DIAGNOSIS — Z3202 Encounter for pregnancy test, result negative: Secondary | ICD-10-CM | POA: Diagnosis not present

## 2018-11-15 DIAGNOSIS — Z113 Encounter for screening for infections with a predominantly sexual mode of transmission: Secondary | ICD-10-CM

## 2018-11-15 LAB — POCT URINE PREGNANCY: Preg Test, Ur: NEGATIVE

## 2018-11-15 MED ORDER — MEDROXYPROGESTERONE ACETATE 150 MG/ML IM SUSP
150.0000 mg | Freq: Once | INTRAMUSCULAR | Status: AC
  Administered 2018-11-15: 15:00:00 150 mg via INTRAMUSCULAR

## 2018-11-15 MED ORDER — MEDROXYPROGESTERONE ACETATE 150 MG/ML IM SUSP: 150.0000 mg | INTRAMUSCULAR | 3 refills | Status: DC

## 2018-11-15 NOTE — Addendum Note (Signed)
Addended by: Derrek Monaco A on: 11/15/2018 03:40 PM   Modules accepted: Orders

## 2018-11-15 NOTE — Progress Notes (Signed)
Patient ID: Krista Monroe, female   DOB: Apr 17, 1986, 33 y.o.   MRN: [id number] History of Present Illness: Robin is a 33 year old black female, single, G2P1011 in requesting STD screening and to get back on depo, [place] depo was in February and last sex was about 6 weeks ago. Had normal  Pap with negative HPV 11/10/16.   Current Medications, Allergies, Past Medical History, Past Surgical History, Family History and Social History were reviewed in [employer] record.     Review of Systems: She denies vaginal discharge, itching or burning No sex in about 6 weeks     Physical Exam:BP 121/76 (BP Location: Left Arm, Patient Position: Sitting, Cuff Size: Normal)   Pulse 70   Ht 5\' 5"  (1.651 m)   Wt 172 lb 8 oz (78.2 kg)   BMI 28.71 kg/m   UPT is negative. General:  Well developed, well nourished, no acute distress Skin:  Warm and dry Pelvic:  External genitalia is normal in appearance, no lesions.  The vagina is normal in appearance. Urethra has no lesions or masses. The cervix is bulbous.Nuswab obtained.  Uterus is felt to be normal size, shape, and contour.  No adnexal masses or tenderness noted.Bladder is non tender, no masses felt. Psych:  No mood changes, alert and cooperative,seems happy Fall risk is low Examination chaperoned by Levy Pupa LPN. Marcie Bal will administer depo today in office, pt brought it with her.  She declines HIV and RPR today.  Impression: 1. Screening examination for STD (sexually transmitted disease)   2. Encounter for surveillance of injectable contraceptive   3. Pregnancy examination or test, negative result       Plan: Return in 12 weeks for depo Will refill depo for 1 year Meds ordered this encounter  Medications  . medroxyPROGESTERone (DEPO-PROVERA) injection 150 mg  . medroxyPROGESTERone (DEPO-PROVERA) 150 MG/ML injection    Sig: Inject 1 mL (150 mg total) into the muscle every 3 (three) months.    Dispense:  1 mL   Refill:  3    Order Specific Question:   Supervising Provider    Answer:   Tania Ade H [[id number]]  Pap and physical in 1 year

## 2018-11-21 ENCOUNTER — Other Ambulatory Visit: Payer: Self-pay | Admitting: Adult Health

## 2018-11-21 LAB — NUSWAB VAGINITIS PLUS (VG+)
Atopobium vaginae: HIGH Score — AB
Candida albicans, NAA: NEGATIVE
Candida glabrata, NAA: NEGATIVE
Chlamydia trachomatis, NAA: NEGATIVE
Megasphaera 1: HIGH Score — AB
Neisseria gonorrhoeae, NAA: NEGATIVE
Trich vag by NAA: NEGATIVE

## 2018-11-21 LAB — SPECIMEN STATUS REPORT

## 2018-11-21 MED ORDER — METRONIDAZOLE 500 MG PO TABS: 500.0000 mg | ORAL_TABLET | Freq: Two times a day (BID) | ORAL | 0 refills | Status: DC

## 2018-11-21 NOTE — Progress Notes (Signed)
rx flagyl, +BV on nuswab 

## 2019-02-07 ENCOUNTER — Ambulatory Visit: Payer: Medicaid Other

## 2019-02-13 ENCOUNTER — Other Ambulatory Visit: Payer: Self-pay

## 2019-02-13 ENCOUNTER — Ambulatory Visit (INDEPENDENT_AMBULATORY_CARE_PROVIDER_SITE_OTHER): Payer: 59

## 2019-02-13 DIAGNOSIS — Z3042 Encounter for surveillance of injectable contraceptive: Secondary | ICD-10-CM | POA: Diagnosis not present

## 2019-02-13 MED ORDER — MEDROXYPROGESTERONE ACETATE 150 MG/ML IM SUSP
150.0000 mg | Freq: Once | INTRAMUSCULAR | Status: AC
  Administered 2019-02-13: 150 mg via INTRAMUSCULAR

## 2019-02-13 NOTE — Progress Notes (Signed)
   NURSE VISIT- INJECTION  SUBJECTIVE:  Krista Monroe is a 33 y.o. G87P1011 female here for a Depo Provera for contraception/period management. She is a GYN patient.   OBJECTIVE:  There were no vitals taken for this visit.  Appears well, in no apparent distress  Injection administered in: Right deltoid  Meds ordered this encounter  Medications  . medroxyPROGESTERone (DEPO-PROVERA) injection 150 mg    ASSESSMENT:  Depo Provera for contraception/period management  PLAN: Follow-up: in 11-13 weeks for next Depo   Ladonna Snide  02/13/2019 9:00 AM

## 2019-02-15 ENCOUNTER — Other Ambulatory Visit: Payer: Self-pay | Admitting: *Deleted

## 2019-05-08 ENCOUNTER — Ambulatory Visit: Payer: Medicaid Other

## 2019-05-26 ENCOUNTER — Other Ambulatory Visit: Payer: Self-pay

## 2019-05-26 ENCOUNTER — Ambulatory Visit: Payer: 59 | Attending: Internal Medicine

## 2019-05-26 DIAGNOSIS — Z20822 Contact with and (suspected) exposure to covid-19: Secondary | ICD-10-CM

## 2019-05-26 NOTE — Addendum Note (Signed)
Addended by: Gena Fray on: 05/26/2019 11:36 AM   Modules accepted: Orders

## 2019-10-01 DIAGNOSIS — Z20828 Contact with and (suspected) exposure to other viral communicable diseases: Secondary | ICD-10-CM | POA: Diagnosis not present
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
## 2019-11-15 ENCOUNTER — Other Ambulatory Visit (HOSPITAL_COMMUNITY)
Admission: RE | Admit: 2019-11-15 | Discharge: 2019-11-15 | Disposition: A | Discharge status: Home / Self Care | Payer: 59 | Source: Ambulatory Visit | Attending: Obstetrics and Gynecology | Admitting: Obstetrics and Gynecology

## 2019-11-15 ENCOUNTER — Ambulatory Visit (INDEPENDENT_AMBULATORY_CARE_PROVIDER_SITE_OTHER): Payer: 59 | Admitting: Obstetrics and Gynecology

## 2019-11-15 ENCOUNTER — Encounter: Payer: Self-pay | Admitting: Obstetrics and Gynecology

## 2019-11-15 VITALS — BP 97/53 | HR 74 | Ht 65.0 in | Wt 192.4 lb

## 2019-11-15 DIAGNOSIS — Z3009 Encounter for other general counseling and advice on contraception: Secondary | ICD-10-CM

## 2019-11-15 DIAGNOSIS — Z01419 Encounter for gynecological examination (general) (routine) without abnormal findings: Secondary | ICD-10-CM | POA: Diagnosis not present

## 2019-11-15 LAB — POCT URINE PREGNANCY: Preg Test, Ur: POSITIVE — AB

## 2019-11-16 LAB — CYTOLOGY - PAP
Chlamydia: NEGATIVE
Comment: NEGATIVE
Comment: NEGATIVE
Comment: NORMAL
Diagnosis: NEGATIVE
High risk HPV: NEGATIVE
Neisseria Gonorrhea: NEGATIVE

## 2019-11-19 MED ORDER — PRENATAL PLUS 27-1 MG PO TABS: 1.0000 | ORAL_TABLET | Freq: Every day | ORAL | 6 refills | Status: DC

## 2019-11-21 ENCOUNTER — Other Ambulatory Visit: Payer: Self-pay | Admitting: Obstetrics & Gynecology

## 2019-11-21 DIAGNOSIS — O3680X0 Pregnancy with inconclusive fetal viability, not applicable or unspecified: Secondary | ICD-10-CM

## 2019-11-22 ENCOUNTER — Ambulatory Visit (INDEPENDENT_AMBULATORY_CARE_PROVIDER_SITE_OTHER): Payer: 59

## 2019-11-22 DIAGNOSIS — Z3A01 Less than 8 weeks gestation of pregnancy: Secondary | ICD-10-CM | POA: Diagnosis not present

## 2019-11-22 DIAGNOSIS — O3680X0 Pregnancy with inconclusive fetal viability, not applicable or unspecified: Secondary | ICD-10-CM | POA: Diagnosis not present

## 2019-11-22 NOTE — Progress Notes (Signed)
[place] 6+3 wks,single IUP with YS,FHR 107 bpm,crl 6.42 mm,normal right ovary,simple left corpus luteal cyst 3.3 x 2.5 x 2.8 cm
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
## 2020-01-02 ENCOUNTER — Other Ambulatory Visit: Payer: Self-pay | Admitting: Obstetrics & Gynecology

## 2020-01-02 DIAGNOSIS — Z3682 Encounter for antenatal screening for nuchal translucency: Secondary | ICD-10-CM

## 2020-01-03 ENCOUNTER — Encounter: Payer: Medicaid Other | Admitting: Women's Health

## 2020-01-03 ENCOUNTER — Other Ambulatory Visit: Payer: 59

## 2020-01-03 ENCOUNTER — Ambulatory Visit: Payer: 59 | Admitting: *Deleted

## 2020-01-03 DIAGNOSIS — Z3490 Encounter for supervision of normal pregnancy, unspecified, unspecified trimester: Secondary | ICD-10-CM | POA: Insufficient documentation

## 2020-11-28 ENCOUNTER — Ambulatory Visit: Payer: Medicaid Other | Admitting: Adult Health

## 2020-12-16 ENCOUNTER — Ambulatory Visit (INDEPENDENT_AMBULATORY_CARE_PROVIDER_SITE_OTHER): Payer: Medicaid Other | Admitting: Adult Health

## 2020-12-16 ENCOUNTER — Encounter: Payer: Self-pay | Admitting: Adult Health

## 2020-12-16 ENCOUNTER — Other Ambulatory Visit (HOSPITAL_COMMUNITY)
Admission: RE | Admit: 2020-12-16 | Discharge: 2020-12-16 | Disposition: A | Discharge status: Home / Self Care | Payer: Medicaid Other | Source: Ambulatory Visit | Attending: Adult Health | Admitting: Adult Health

## 2020-12-16 ENCOUNTER — Other Ambulatory Visit: Payer: Self-pay

## 2020-12-16 VITALS — BP 117/73 | HR 74 | Ht 66.0 in | Wt 174.0 lb

## 2020-12-16 DIAGNOSIS — Z01419 Encounter for gynecological examination (general) (routine) without abnormal findings: Secondary | ICD-10-CM | POA: Insufficient documentation

## 2020-12-16 DIAGNOSIS — Z3202 Encounter for pregnancy test, result negative: Secondary | ICD-10-CM | POA: Diagnosis not present

## 2020-12-16 DIAGNOSIS — Z113 Encounter for screening for infections with a predominantly sexual mode of transmission: Secondary | ICD-10-CM | POA: Diagnosis not present

## 2020-12-16 DIAGNOSIS — Z30013 Encounter for initial prescription of injectable contraceptive: Secondary | ICD-10-CM | POA: Diagnosis not present

## 2020-12-16 LAB — POCT URINE PREGNANCY: Preg Test, Ur: NEGATIVE

## 2020-12-16 MED ORDER — MEDROXYPROGESTERONE ACETATE 150 MG/ML IM SUSP
150.0000 mg | Freq: Once | INTRAMUSCULAR | Status: AC
  Administered 2020-12-16: 150 mg via INTRAMUSCULAR

## 2020-12-16 MED ORDER — MEDROXYPROGESTERONE ACETATE 150 MG/ML IM SUSP: 150.0000 mg | INTRAMUSCULAR | 4 refills | Status: DC

## 2020-12-16 NOTE — Progress Notes (Signed)
Patient ID: Krista Monroe, female   DOB: [birth year], 35 y.o.   MRN: [id number] History of Present Illness: Krista Monroe is a 35 year old black female,single, G3P1021 in for a well woman gyn exam. And she requests STD testing and wants to discuss birth control, she is thinking depo.    Current Medications, Allergies, Past Medical History, Past Surgical History, Family History and Social History were reviewed in [employer] record.     Review of Systems: Patient denies any headaches, hearing loss, fatigue, blurred vision, shortness of breath, chest pain, abdominal pain, problems with bowel movements, urination, or intercourse. No joint pain or mood swings.     Physical Exam:BP 117/73 (BP Location: Left Arm, Patient Position: Sitting, Cuff Size: Normal)   Pulse 74   Ht 5\' 6"  (1.676 m)   Wt 174 lb (78.9 kg)   LMP 12/14/2020   Breastfeeding No   BMI 28.08 kg/m  UPT is negative. General:  Well developed, well nourished, no acute distress Skin:  Warm and dry Neck:  Midline trachea, normal thyroid, good ROM, no lymphadenopathy Lungs; Clear to auscultation bilaterally Breast:  No dominant palpable mass, retraction, or nipple discharge Cardiovascular: Regular rate and rhythm Abdomen:  Soft, non tender, no hepatosplenomegaly Pelvic:  External genitalia is normal in appearance, no lesions.  The vagina is normal in appearance. Urethra has no lesions or masses. The cervix is bulbous.  Uterus is felt to be normal size, shape, and contour.  No adnexal masses or tenderness noted.Bladder is non tender, no masses felt.CV swab obtained. Rectal: Deferred Extremities/musculoskeletal:  No swelling or varicosities noted, no clubbing or cyanosis Psych:  No mood changes, alert and cooperative,seems happy AA is 2 Fall risk is low Depression screen [facility] 2/9 12/16/2020 11/15/2019 10/13/2017  Decreased Interest 0 0 0  Down, Depressed, Hopeless 1 0 0  PHQ - 2 Score 1 0 0  Altered sleeping 0 1 -   Tired, decreased energy 1 1 -  Change in appetite 0 1 -  Feeling bad or failure about yourself  0 0 -  Trouble concentrating 0 1 -  Moving slowly or fidgety/restless 0 0 -  Suicidal thoughts 0 0 -  PHQ-9 Score 2 4 -  Difficult doing work/chores - - -    GAD 7 : Generalized Anxiety Score 12/16/2020 11/15/2019  Nervous, Anxious, on Edge 0 0  Control/stop worrying 1 1  Worry too much - different things 1 1  Trouble relaxing 0 0  Restless 0 0  Easily annoyed or irritable 0 1  Afraid - awful might happen 0 0  Total GAD 7 Score 2 3      Upstream - 12/16/20 1104       Pregnancy Intention Screening   Does the patient want to become pregnant in the next year? No    Does the patient's partner want to become pregnant in the next year? No    Would the patient like to discuss contraceptive options today? Yes      Contraception Wrap Up   Current Method Abstinence    End Method Hormonal Injection;Female Condom    Contraception Counseling Provided Yes            Examination chaperoned by 12/18/20 LPN   Impression and Plan: 1. Pregnancy examination or test, negative result   2. Encounter for well woman exam with routine gynecological exam Physical in 1 year Pap 2024  3. Screening examination for STD (sexually transmitted disease) CV swab  sent for GC/CHL,trich,BV and yeast She declines blood work   4. Encounter for initial prescription of injectable contraceptive Discussed options and she wants depo Will give first shot in office now and use condoms Meds ordered this encounter  Medications   medroxyPROGESTERone (DEPO-PROVERA) 150 MG/ML injection    Sig: Inject 1 mL (150 mg total) into the muscle every 3 (three) months.    Dispense:  1 mL    Refill:  4    Order Specific Question:   Supervising Provider    Answer:   Duane Lope H [[id number]]   Return in 12 weeks for depo

## 2020-12-17 LAB — CERVICOVAGINAL ANCILLARY ONLY
Bacterial Vaginitis (gardnerella): POSITIVE — AB
Candida Glabrata: NEGATIVE
Candida Vaginitis: NEGATIVE
Chlamydia: NEGATIVE
Comment: NEGATIVE
Comment: NEGATIVE
Comment: NEGATIVE
Comment: NEGATIVE
Comment: NEGATIVE
Comment: NORMAL
Neisseria Gonorrhea: NEGATIVE
Trichomonas: NEGATIVE

## 2020-12-18 ENCOUNTER — Other Ambulatory Visit: Payer: Self-pay | Admitting: Adult Health

## 2020-12-18 MED ORDER — METRONIDAZOLE 500 MG PO TABS
500.0000 mg | ORAL_TABLET | Freq: Two times a day (BID) | ORAL | 0 refills | Status: DC
Start: 2020-12-18 — End: 2022-02-02

## 2020-12-18 NOTE — Progress Notes (Signed)
+  BV on vaginal swab rx flagyl  

## 2021-03-12 ENCOUNTER — Ambulatory Visit: Payer: Medicaid Other

## 2021-06-13 ENCOUNTER — Other Ambulatory Visit: Payer: Medicaid Other

## 2021-07-16 ENCOUNTER — Ambulatory Visit (HOSPITAL_COMMUNITY)
Admission: EM | Admit: 2021-07-16 | Discharge: 2021-07-16 | Disposition: A | Discharge status: Home / Self Care | Payer: Medicaid Other | Attending: Psychiatry | Admitting: Psychiatry

## 2021-07-16 DIAGNOSIS — Z9151 Personal history of suicidal behavior: Secondary | ICD-10-CM | POA: Insufficient documentation

## 2021-07-16 DIAGNOSIS — Z818 Family history of other mental and behavioral disorders: Secondary | ICD-10-CM | POA: Insufficient documentation

## 2021-07-16 DIAGNOSIS — Z653 Problems related to other legal circumstances: Secondary | ICD-10-CM | POA: Insufficient documentation

## 2021-07-16 DIAGNOSIS — F4321 Adjustment disorder with depressed mood: Secondary | ICD-10-CM | POA: Insufficient documentation

## 2021-07-16 DIAGNOSIS — F1210 Cannabis abuse, uncomplicated: Secondary | ICD-10-CM | POA: Insufficient documentation

## 2021-07-16 DIAGNOSIS — Z634 Disappearance and death of family member: Secondary | ICD-10-CM | POA: Insufficient documentation

## 2021-07-16 NOTE — ED Provider Notes (Signed)
Behavioral Health Urgent Care Medical Screening Exam ? ?Patient Name: Krista Monroe ?MRN: [id number] ?Date of Evaluation: 07/16/21 ?Chief Complaint:   ?Diagnosis:  ?Final diagnoses:  ?Adjustment disorder with depressed mood  ? ? ?History of Present illness: Krista Monroe is a 36 y.o. female. Patient presents voluntarily to [facility] behavioral health for walk-in assessment.  Patient encouraged to seek evaluation by class administrator for court required substance use disorder teaching, "Task." ? ?Krista Monroe reports Krista Monroe has been diagnosed with depression but is not currently linked with outpatient psychiatry.  No current medication management.  Krista Monroe has been prescribed an antidepressant in the past, unable to recall the name of this medication.  Krista Monroe denies any history of inpatient psychiatric hospitalizations.  Family mental health history includes her mother who has been diagnosed with bipolar disorder as well as schizophrenia.  Krista Monroe would like to follow-up with outpatient psychiatry for counseling.  Krista Monroe states "I would like to talk to somebody some times." ? ?Patient reports recent stressors include being arrested for cyber stalking.  Krista Monroe required that Krista Monroe attend education surrounding substance use disorder, Task program.  Krista Monroe endorses daily marijuana use.  Krista Monroe reports chronic use of marijuana daily, last use on yesterday.  Krista Monroe denies substance and alcohol use aside from marijuana. ? ?Additional stressors include the death of her grandmother in Jul 28, 2020.  Krista Monroe becomes tearful when talking about the loss of her grandmother. ? ?Patient is assessed face-to-face by nurse practitioner.  Krista Monroe is seated in assessment area, no acute distress.  Krista Monroe is alert and oriented, pleasant and cooperative during assessment.  ?Krista Monroe presents with euthymic mood, congruent affect. Krista Monroe denies suicidal and homicidal ideations. Krista Monroe endorse history of  three previous suicide attempts, most recent attempt at age 48. Krista Monroe denies history of  non suicidal self-harm behavior.  Krista Monroe easily contracts verbally for safety with this Clinical research associate. ? ?Krista Monroe has normal speech and behavior.  Krista Monroe denies both auditory and visual hallucinations.  Patient is able to converse coherently with goal-directed thoughts and no distractibility or preoccupation.  Krista Monroe denies paranoia.  Objectively there is no evidence of psychosis/mania or delusional thinking. ? ?Anastazja resides in [place] with her 40 year old daughter, her cousin, cousin's girlfriend and cousin's baby. Krista Monroe denies access to weapons. Krista Monroe is employed in [employer], works evening into late night shift.  Patient endorses average sleep and appetite. ? ?Patient offered support and encouragement.  Krista Monroe denies any person to contact for collateral information at this time.  Does not believe this would be necessary. ? ? ?Psychiatric Specialty Exam ? ?Presentation  ?General Appearance:Appropriate for Environment; Casual; Well Groomed ? ?Eye Contact:Good ? ?Speech:Clear and Coherent; Normal Rate ? ?Speech Volume:Normal ? ?Handedness:Right ? ? ?Mood and Affect  ?Mood:Euthymic ? ?Affect:Appropriate; Congruent ? ? ?Thought Process  ?Thought Processes:Coherent; Goal Directed; Linear ? ?Descriptions of Associations:Intact ? ?Orientation:Full (Time, Place and Person) ? ?Thought Content:Logical; WDL ?   Hallucinations:None ? ?Ideas of Reference:None ? ?Suicidal Thoughts:No ? ?Homicidal Thoughts:No ? ? ?Sensorium  ?Memory:Immediate Good; Recent Good ? ?Judgment:Good ? ?Insight:Good ? ? ?Executive Functions  ?Concentration:Good ? ?Attention Span:Good ? ?Recall:Good ? ?Fund of Knowledge:Good ? ?Language:Good ? ? ?Psychomotor Activity  ?Psychomotor Activity:Normal ? ? ?Assets  ?Assets:Communication Skills; Desire for Improvement; Financial Resources/Insurance; Housing; Intimacy; Leisure Time; Physical Health; Resilience; Social Support; Transportation; Vocational/Educational ? ? ?Sleep  ?Sleep:Fair ? ?Number of hours: No data  recorded ? ?No data recorded ? ?Physical Exam: ?Physical Exam ?Vitals and nursing note reviewed.  ?Constitutional:   ?  Appearance: Normal appearance. Krista Monroe is well-developed and normal weight.  ?HENT:  ?   Head: Normocephalic and atraumatic.  ?   Nose: Nose normal.  ?Cardiovascular:  ?   Rate and Rhythm: Normal rate.  ?Pulmonary:  ?   Effort: Pulmonary effort is normal.  ?Musculoskeletal:     ?   General: Normal range of motion.  ?   Cervical back: Normal range of motion.  ?Skin: ?   General: Skin is warm and dry.  ?Neurological:  ?   Mental Status: Krista Monroe is alert and oriented to person, place, and time.  ?Psychiatric:     ?   Attention and Perception: Attention and perception normal.     ?   Mood and Affect: Mood and affect normal.     ?   Speech: Speech normal.     ?   Behavior: Behavior normal. Behavior is cooperative.     ?   Thought Content: Thought content normal.     ?   Cognition and Memory: Cognition and memory normal.     ?   Judgment: Judgment normal.  ? ?Review of Systems  ?Constitutional: Negative.   ?HENT: Negative.    ?Eyes: Negative.   ?Respiratory: Negative.    ?Cardiovascular: Negative.   ?Gastrointestinal: Negative.   ?Genitourinary: Negative.   ?Musculoskeletal: Negative.   ?Skin: Negative.   ?Neurological: Negative.   ?Endo/Heme/Allergies: Negative.   ?Psychiatric/Behavioral:  Positive for substance abuse.   ?There were no vitals taken for this visit. There is no height or weight on file to calculate BMI. ? ?Musculoskeletal: ?Strength & Muscle Tone: within normal limits ?Gait & Station: normal ?Patient leans: N/A ? ? ?[facility] MSE Discharge Disposition for Follow up and Recommendations: ?Based on my evaluation I certify that psychiatric inpatient services furnished can reasonably be expected to improve the patient's condition which I recommend transfer to an appropriate accepting facility.  ?Patient reviewed with Dr. Bronwen Betters. ?Follow-up with outpatient psychiatry, resources provided. ? ?Lenard Lance,  FNP ?07/16/2021, 12:16 PM ? ?
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
## 2021-07-16 NOTE — ED Notes (Signed)
Pt discharged in no acute distress. Verbalized understanding of discharge instructions reviewed on AVS. Pt escorted to lobby. Safety maintained. ?

## 2021-07-16 NOTE — Discharge Instructions (Addendum)

## 2021-08-14 ENCOUNTER — Other Ambulatory Visit (HOSPITAL_COMMUNITY)
Admission: RE | Admit: 2021-08-14 | Discharge: 2021-08-14 | Disposition: A | Discharge status: Home / Self Care | Payer: Medicaid Other | Source: Ambulatory Visit | Attending: Obstetrics & Gynecology | Admitting: Obstetrics & Gynecology

## 2021-08-14 ENCOUNTER — Other Ambulatory Visit (INDEPENDENT_AMBULATORY_CARE_PROVIDER_SITE_OTHER): Payer: Medicaid Other

## 2021-08-14 DIAGNOSIS — Z113 Encounter for screening for infections with a predominantly sexual mode of transmission: Secondary | ICD-10-CM

## 2021-08-14 NOTE — Progress Notes (Signed)
? ?  NURSE VISIT- STD ? ?SUBJECTIVE:  ?Krista Monroe is a 36 y.o. [id number] GYN patientfemale here for a vaginal swab for STD screen.  She reports the following symptoms: none. ?Denies abnormal vaginal bleeding, significant pelvic pain, fever, or UTI symptoms. ? ?OBJECTIVE:  ?There were no vitals taken for this visit.  ?Appears well, in no apparent distress ? ?ASSESSMENT: ?Vaginal swab for STD screen ? ?PLAN: ?Self-collected vaginal probe for Gonorrhea, Chlamydia, Trichomonas sent to lab ?Treatment: to be determined once results are received ?Follow-up as needed if symptoms persist/worsen, or new symptoms develop ? ?Tasheem Elms A Ronnae Kaser  ?08/14/2021 ?4:09 PM  ?

## 2021-08-19 LAB — CERVICOVAGINAL ANCILLARY ONLY
Chlamydia: NEGATIVE
Comment: NEGATIVE
Comment: NEGATIVE
Comment: NORMAL
Neisseria Gonorrhea: NEGATIVE
Trichomonas: NEGATIVE

## 2021-10-01 DIAGNOSIS — Z23 Encounter for immunization: Secondary | ICD-10-CM | POA: Diagnosis not present

## 2022-01-26 ENCOUNTER — Telehealth: Payer: Self-pay | Admitting: Adult Health

## 2022-01-26 NOTE — Telephone Encounter (Signed)
Hey this pt needs a refill for her celexa and also for her depo she has an appt on 03/10/2022 she needs them until she comes for these appts

## 2022-01-27 NOTE — Telephone Encounter (Signed)
Left message @ 9:30 am. JSY 

## 2022-01-27 NOTE — Telephone Encounter (Signed)
Left message @ 3:41 pm. JSY 

## 2022-01-29 NOTE — Telephone Encounter (Signed)
Left message @ 10:34 am. Advised can send a MyChart message. [place]

## 2022-02-02 ENCOUNTER — Encounter: Payer: Self-pay | Admitting: Adult Health

## 2022-02-02 ENCOUNTER — Ambulatory Visit: Payer: Medicaid Other | Admitting: Adult Health

## 2022-02-02 VITALS — BP 126/82 | HR 60 | Ht 66.0 in | Wt 174.5 lb

## 2022-02-02 DIAGNOSIS — F32A Depression, unspecified: Secondary | ICD-10-CM

## 2022-02-02 DIAGNOSIS — F419 Anxiety disorder, unspecified: Secondary | ICD-10-CM

## 2022-02-02 MED ORDER — CITALOPRAM HYDROBROMIDE 20 MG PO TABS: 20.0000 mg | ORAL_TABLET | Freq: Every day | ORAL | 2 refills | Status: DC

## 2022-02-02 NOTE — Progress Notes (Signed)
  Subjective:     Patient ID: Krista Monroe, female   DOB: October 24, 1985, 36 y.o.   MRN: [id number]  HPI Krista Monroe is a 36 year old black female,single, G3P1021 in complaining of feeling sad and teary at times, she has new job and going to school. She wakes up some at night too. She used to take celexa, but has not been on any in over a year.  Last pap was negative malignancy 11/15/19.  Review of Systems Feels sad at times Teary at times Wakes up in middle of night at times Reviewed past medical,surgical, social and family history. Reviewed medications and allergies.     Objective:   Physical Exam BP 126/82 (BP Location: Left Arm, Patient Position: Sitting, Cuff Size: Normal)   Pulse 60   Ht 5\' 6"  (1.676 m)   Wt 174 lb 8 oz (79.2 kg)   LMP 01/11/2022 (Approximate)   BMI 28.17 kg/m     Skin warm and dry.. Lungs: clear to ausculation bilaterally. Cardiovascular: regular rate and rhythm.  Fall risk is low    02/02/2022   11:13 AM 12/16/2020   11:12 AM 11/15/2019   10:51 AM  Depression screen PHQ 2/9  Decreased Interest 1 0 0  Down, Depressed, Hopeless 1 1 0  PHQ - 2 Score 2 1 0  Altered sleeping 1 0 1  Tired, decreased energy 1 1 1   Change in appetite 0 0 1  Feeling bad or failure about yourself  1 0 0  Trouble concentrating 1 0 1  Moving slowly or fidgety/restless 0 0 0  Suicidal thoughts 0 0 0  PHQ-9 Score 6 2 4        02/02/2022   11:15 AM 12/16/2020   11:13 AM 11/15/2019   10:51 AM  GAD 7 : Generalized Anxiety Score  Nervous, Anxious, on Edge 1 0 0  Control/stop worrying 2 1 1   Worry too much - different things 2 1 1   Trouble relaxing 1 0 0  Restless 1 0 0  Easily annoyed or irritable 1 0 1  Afraid - awful might happen 0 0 0  Total GAD 7 Score 8 2 3       Upstream - 02/02/22 1111       Pregnancy Intention Screening   Does the patient want to become pregnant in the next year? No    Does the patient's partner want to become pregnant in the next year? No    Would  the patient like to discuss contraceptive options today? No      Contraception Wrap Up   Current Method Hormonal Injection    End Method Hormonal Injection             Assessment:     1. Anxiety and depression Feels sad and teary at times Will rx Celexa, has used in the past Meds ordered this encounter  Medications   citalopram (CELEXA) 20 MG tablet    Sig: Take 1 tablet (20 mg total) by mouth daily.    Dispense:  30 tablet    Refill:  2    Order Specific Question:   Supervising Provider    Answer:   Florian Buff [[id number]]   Take time to meditate or pray     Plan:     Follow up 03/10/22 for ROS or sooner if needed

## 2022-02-02 NOTE — Telephone Encounter (Signed)
Pt has appt today. Closing encounter. [place]

## 2022-02-02 NOTE — Telephone Encounter (Signed)
Left message @ 9:16 am. JSY

## 2022-03-10 ENCOUNTER — Ambulatory Visit: Payer: Medicaid Other | Admitting: Adult Health

## 2022-07-26 ENCOUNTER — Encounter (HOSPITAL_COMMUNITY): Payer: Self-pay

## 2022-07-26 ENCOUNTER — Emergency Department (HOSPITAL_COMMUNITY)
Admission: EM | Admit: 2022-07-26 | Discharge: 2022-07-26 | Disposition: A | Discharge status: Home / Self Care | Payer: No Typology Code available for payment source | Attending: Emergency Medicine | Admitting: Emergency Medicine

## 2022-07-26 ENCOUNTER — Other Ambulatory Visit: Payer: Self-pay

## 2022-07-26 DIAGNOSIS — M546 Pain in thoracic spine: Secondary | ICD-10-CM | POA: Diagnosis not present

## 2022-07-26 DIAGNOSIS — M5459 Other low back pain: Secondary | ICD-10-CM | POA: Diagnosis not present

## 2022-07-26 DIAGNOSIS — M545 Low back pain, unspecified: Secondary | ICD-10-CM | POA: Insufficient documentation

## 2022-07-26 DIAGNOSIS — Y92410 Unspecified street and highway as the place of occurrence of the external cause: Secondary | ICD-10-CM | POA: Insufficient documentation

## 2022-07-26 MED ORDER — METHOCARBAMOL 500 MG PO TABS: 500.0000 mg | ORAL_TABLET | Freq: Four times a day (QID) | ORAL | 0 refills | Status: DC | PRN

## 2022-07-26 MED ORDER — NAPROXEN 500 MG PO TABS: 500.0000 mg | ORAL_TABLET | Freq: Two times a day (BID) | ORAL | 0 refills | Status: DC

## 2022-07-26 MED ORDER — IBUPROFEN 400 MG PO TABS
600.0000 mg | ORAL_TABLET | Freq: Once | ORAL | Status: AC
  Administered 2022-07-26: 600 mg via ORAL
  Filled 2022-07-26: qty 2
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
## 2022-09-18 DIAGNOSIS — R109 Unspecified abdominal pain: Secondary | ICD-10-CM | POA: Diagnosis not present

## 2022-09-18 DIAGNOSIS — Z3202 Encounter for pregnancy test, result negative: Secondary | ICD-10-CM | POA: Diagnosis not present

## 2022-09-18 DIAGNOSIS — N3001 Acute cystitis with hematuria: Secondary | ICD-10-CM | POA: Diagnosis not present

## 2022-09-28 ENCOUNTER — Ambulatory Visit: Payer: Medicaid Other | Admitting: Adult Health

## 2022-11-04 ENCOUNTER — Other Ambulatory Visit (HOSPITAL_COMMUNITY)
Admission: RE | Admit: 2022-11-04 | Discharge: 2022-11-04 | Disposition: A | Discharge status: Home / Self Care | Payer: Medicaid Other | Source: Ambulatory Visit | Attending: Adult Health | Admitting: Adult Health

## 2022-11-04 ENCOUNTER — Ambulatory Visit (INDEPENDENT_AMBULATORY_CARE_PROVIDER_SITE_OTHER): Payer: Medicaid Other | Admitting: Adult Health

## 2022-11-04 ENCOUNTER — Encounter: Payer: Self-pay | Admitting: Adult Health

## 2022-11-04 VITALS — BP 117/71 | HR 78 | Ht 65.0 in | Wt 165.0 lb

## 2022-11-04 DIAGNOSIS — Z3009 Encounter for other general counseling and advice on contraception: Secondary | ICD-10-CM | POA: Insufficient documentation

## 2022-11-04 DIAGNOSIS — F419 Anxiety disorder, unspecified: Secondary | ICD-10-CM | POA: Diagnosis not present

## 2022-11-04 DIAGNOSIS — Z0000 Encounter for general adult medical examination without abnormal findings: Secondary | ICD-10-CM | POA: Insufficient documentation

## 2022-11-04 DIAGNOSIS — Z3202 Encounter for pregnancy test, result negative: Secondary | ICD-10-CM | POA: Diagnosis not present

## 2022-11-04 DIAGNOSIS — R222 Localized swelling, mass and lump, trunk: Secondary | ICD-10-CM | POA: Insufficient documentation

## 2022-11-04 DIAGNOSIS — F32A Depression, unspecified: Secondary | ICD-10-CM

## 2022-11-04 LAB — POCT URINE PREGNANCY: Preg Test, Ur: NEGATIVE

## 2022-11-04 MED ORDER — CITALOPRAM HYDROBROMIDE 40 MG PO TABS: 40.0000 mg | ORAL_TABLET | Freq: Every day | ORAL | 12 refills | Status: AC

## 2022-11-04 MED ORDER — SULFAMETHOXAZOLE-TRIMETHOPRIM 800-160 MG PO TABS: 1.0000 | ORAL_TABLET | Freq: Two times a day (BID) | ORAL | 0 refills | Status: DC

## 2022-11-04 NOTE — Progress Notes (Signed)
Patient ID: Krista Monroe, female   DOB: April 16, 1986, 37 y.o.   MRN: [id number] History of Present Illness: Krista Monroe is a 37 year old black female,single, G3P1021, in for a well woman gyn exam and pap. Has knot under skin in right between vagina and rectum that swells when on period and is tender.    Current Medications, Allergies, Past Medical History, Past Surgical History, Family History and Social History were reviewed in [employer] record.     Review of Systems: Patient denies any headaches, hearing loss, fatigue, blurred vision, shortness of breath, chest pain, abdominal pain, problems with bowel movements, urination, or intercourse. No joint pain or mood swings.  Periods not regular On celexa thinks it needs increasing  See HPI for positive  Physical Exam:BP 117/71 (BP Location: Left Arm, Patient Position: Sitting, Cuff Size: Normal)   Pulse 78   Ht 5\' 5"  (1.651 m)   Wt 165 lb (74.8 kg)   LMP 09/20/2022 (Approximate)   BMI 27.46 kg/m  UPT is negative General:  Well developed, well nourished, no acute distress Skin:  Warm and dry Neck:  Midline trachea, normal thyroid, good ROM, no lymphadenopathy Lungs; Clear to auscultation bilaterally Breast:  No dominant palpable mass, retraction, or nipple discharge Cardiovascular: Regular rate and rhythm Abdomen:  Soft, non tender, no hepatosplenomegaly Pelvic:  External genitalia is normal in appearance, has 1 cm smooth nodule under skin on the right between vagina and rectum, non tender today.  The vagina is normal in appearance. Urethra has no lesions or masses. The cervix is bulbous, app with GC/CHL and HR HPV genotyping performed.  Uterus is felt to be normal size, shape, and contour.  No adnexal masses or tenderness noted.Bladder is non tender, no masses felt. Extremities/musculoskeletal:  No swelling or varicosities noted, no clubbing or cyanosis Psych:  No mood changes, alert and cooperative,seems happy AA is  3 Fall risk is low    11/04/2022   11:53 AM 02/02/2022   11:13 AM 12/16/2020   11:12 AM  Depression screen PHQ 2/9  Decreased Interest 2 1 0  Down, Depressed, Hopeless 2 1 1   PHQ - 2 Score 4 2 1   Altered sleeping 2 1 0  Tired, decreased energy 2 1 1   Change in appetite 0 0 0  Feeling bad or failure about yourself  1 1 0  Trouble concentrating 0 1 0  Moving slowly or fidgety/restless 0 0 0  Suicidal thoughts 0 0 0  PHQ-9 Score 9 6 2        11/04/2022   11:53 AM 02/02/2022   11:15 AM 12/16/2020   11:13 AM 11/15/2019   10:51 AM  GAD 7 : Generalized Anxiety Score  Nervous, Anxious, on Edge 1 1 0 0  Control/stop worrying 1 2 1 1   Worry too much - different things 1 2 1 1   Trouble relaxing 1 1 0 0  Restless 1 1 0 0  Easily annoyed or irritable 1 1 0 1  Afraid - awful might happen 1 0 0 0  Total GAD 7 Score 7 8 2 3       Upstream - 11/04/22 1147       Pregnancy Intention Screening   Does the patient want to become pregnant in the next year? No    Does the patient's partner want to become pregnant in the next year? No    Would the patient like to discuss contraceptive options today? Yes      Contraception  Wrap Up   Current Method Abstinence;Female Condom    End Method Abstinence   to get nexplanon   Contraception Counseling Provided Yes            Examination chaperoned by Malachy Mood LPN   Impression and Plan: 1. Routine general medical examination at a health care facility Pap sent Pap in 3 years if normal Had labs at [facility] she says She needs PCP gave Dr Darletta Moll name and number  - Cytology - PAP( [place])  2. Pregnancy examination or test, negative result - POCT urine pregnancy  3. Anxiety and depression Will increase celexa to 40 mg 1 daily  Meds ordered this encounter  Medications   citalopram (CELEXA) 40 MG tablet    Sig: Take 1 tablet (40 mg total) by mouth daily.    Dispense:  30 tablet    Refill:  12    Order Specific Question:    Supervising Provider    Answer:   Duane Lope H [[id number]]   sulfamethoxazole-trimethoprim (BACTRIM DS) 800-160 MG tablet    Sig: Take 1 tablet by mouth 2 (two) times daily. Take 1 bid    Dispense:  14 tablet    Refill:  0    Order Specific Question:   Supervising Provider    Answer:   Despina Hidden, LUTHER H [[id number]]     4. General counseling and advice for contraceptive management No sex til after nexplanon inserted, periods are not regular now,discussed nexplanon, and can have bleeding or no period. Return 11/20/22 for nexplanon insertion She has had in past Handout given for nexplanon   5. Nodule of buttock Has 1 cm smooth nodule under skin between vagina and rectal area, it is tender and swells when on period  Will rx septra ds to see if helps or not

## 2022-11-06 LAB — CYTOLOGY - PAP
Chlamydia: NEGATIVE
Comment: NEGATIVE
Comment: NEGATIVE
Comment: NORMAL
Diagnosis: NEGATIVE
High risk HPV: NEGATIVE
Neisseria Gonorrhea: NEGATIVE

## 2022-11-20 ENCOUNTER — Encounter: Payer: Medicaid Other | Admitting: Adult Health

## 2022-12-30 ENCOUNTER — Encounter: Payer: Self-pay | Admitting: Advanced Practice Midwife

## 2022-12-30 ENCOUNTER — Ambulatory Visit: Payer: Medicaid Other | Admitting: Advanced Practice Midwife

## 2022-12-30 VITALS — BP 126/82 | HR 65 | Ht 65.0 in | Wt 161.3 lb

## 2022-12-30 DIAGNOSIS — Z3202 Encounter for pregnancy test, result negative: Secondary | ICD-10-CM

## 2022-12-30 DIAGNOSIS — Z30017 Encounter for initial prescription of implantable subdermal contraceptive: Secondary | ICD-10-CM | POA: Diagnosis not present

## 2022-12-30 LAB — POCT URINE PREGNANCY: Preg Test, Ur: NEGATIVE

## 2022-12-30 MED ORDER — ETONOGESTREL 68 MG ~~LOC~~ IMPL
68.0000 mg | DRUG_IMPLANT | Freq: Once | SUBCUTANEOUS | Status: AC
  Administered 2022-12-30: 68 mg via SUBCUTANEOUS

## 2022-12-30 NOTE — Progress Notes (Addendum)
NEXPLANON INSERTION Patient name: Krista Monroe MRN [id number]  Date of birth: [birth year] Subjective Findings:   Krista Monroe is a 37 y.o. G85P1021 African American female being seen today for insertion of a Nexplanon.  Patient's last menstrual period was 12/05/2022. Last sexual intercourse was >2wks ago Last pap July 2024. Results were: NILM w/ HRHPV negative  Risks/benefits/side effects of Nexplanon have been discussed and her questions have been answered.  Specifically, a failure rate of 04/998 has been reported, with an increased failure rate if pt takes St. John's Wort and/or antiseizure medicaitons.  She is aware of the common side effect of irregular bleeding, which the incidence of decreases over time. Signed copy of informed consent in chart.      11/04/2022   11:53 AM 02/02/2022   11:13 AM 12/16/2020   11:12 AM 11/15/2019   10:51 AM 10/13/2017    2:44 PM  Depression screen PHQ 2/9  Decreased Interest 2 1 0 0 0  Down, Depressed, Hopeless 2 1 1  0 0  PHQ - 2 Score 4 2 1  0 0  Altered sleeping 2 1 0 1   Tired, decreased energy 2 1 1 1    Change in appetite 0 0 0 1   Feeling bad or failure about yourself  1 1 0 0   Trouble concentrating 0 1 0 1   Moving slowly or fidgety/restless 0 0 0 0   Suicidal thoughts 0 0 0 0   PHQ-9 Score 9 6 2 4          11/04/2022   11:53 AM 02/02/2022   11:15 AM 12/16/2020   11:13 AM 11/15/2019   10:51 AM  GAD 7 : Generalized Anxiety Score  Nervous, Anxious, on Edge 1 1 0 0  Control/stop worrying 1 2 1 1   Worry too much - different things 1 2 1 1   Trouble relaxing 1 1 0 0  Restless 1 1 0 0  Easily annoyed or irritable 1 1 0 1  Afraid - awful might happen 1 0 0 0  Total GAD 7 Score 7 8 2 3      Pertinent History Reviewed:   Reviewed past medical,surgical, social, obstetrical and family history.  Reviewed problem list, medications and allergies. Objective Findings & Procedure:    Vitals:   12/30/22 1033  BP: 126/82  Pulse: 65   Weight: 161 lb 4.8 oz (73.2 kg)  Height: 5\' 5"  (1.651 m)  Body mass index is 26.84 kg/m.  Results for orders placed or performed in visit on 12/30/22 (from the past 24 hour(s))  POCT urine pregnancy   Collection Time: 12/30/22 10:53 AM  Result Value Ref Range   Preg Test, Ur Negative Negative     Time out was performed.  She is right-handed, so her left arm, approximately 10cm from the medial epicondyle and 3-5cm posterior to the sulcus, was cleansed with alcohol and anesthetized with 2cc of 2% Lidocaine.  The area was cleansed again with betadine and the Nexplanon was inserted per manufacturer's recommendations without difficulty.  3 steri-strips and pressure bandage were applied. The patient tolerated the procedure well.  Assessment & Plan:   1) Nexplanon insertion Pt was instructed to keep the area clean and dry, remove pressure bandage in 24 hours, and keep insertion site covered with the steri-strip for 3-5 days.  Back up contraception was recommended for 2 weeks.  She was given a card indicating date Nexplanon was inserted and date it needs to be removed.  Follow-up PRN problems.  EXP: 2026-04 LOT: [id number]  Orders Placed This Encounter  Procedures   POCT urine pregnancy    Follow-up: Return for prn.  Arabella Merles CNM 12/30/2022 11:10 AM

## 2022-12-30 NOTE — Addendum Note (Signed)
Addended by: Federico Flake A on: 12/30/2022 11:42 AM   Modules accepted: Orders

## 2022-12-31 ENCOUNTER — Encounter: Payer: Medicaid Other | Admitting: Adult Health

## 2023-06-22 ENCOUNTER — Other Ambulatory Visit (HOSPITAL_COMMUNITY)
Admission: RE | Admit: 2023-06-22 | Discharge: 2023-06-22 | Disposition: A | Discharge status: Home / Self Care | Payer: Self-pay | Source: Ambulatory Visit | Attending: Obstetrics & Gynecology | Admitting: Obstetrics & Gynecology

## 2023-06-22 ENCOUNTER — Ambulatory Visit (INDEPENDENT_AMBULATORY_CARE_PROVIDER_SITE_OTHER): Payer: Self-pay

## 2023-06-22 DIAGNOSIS — Z113 Encounter for screening for infections with a predominantly sexual mode of transmission: Secondary | ICD-10-CM | POA: Insufficient documentation

## 2023-06-22 NOTE — Progress Notes (Signed)
   NURSE VISIT- VAGINITIS/STD/POC  SUBJECTIVE:  Krista Monroe is a 38 y.o. K2H0623 GYN patientfemale here for a vaginal swab for STD screen.  She reports the following symptoms: none for 0 days. Denies abnormal vaginal bleeding, significant pelvic pain, fever, or UTI symptoms.  OBJECTIVE:  There were no vitals taken for this visit.  Appears well, in no apparent distress  ASSESSMENT: Vaginal swab for STD screen  PLAN: Self-collected vaginal probe for Gonorrhea, Chlamydia, Trichomonas, Bacterial Vaginosis, Yeast sent to lab Treatment: to be determined once results are received Follow-up as needed if symptoms persist/worsen, or new symptoms develop  Annamarie Dawley  06/22/2023 2:35 PM

## 2023-06-24 ENCOUNTER — Other Ambulatory Visit: Payer: Self-pay | Admitting: Adult Health

## 2023-06-24 LAB — CERVICOVAGINAL ANCILLARY ONLY
Bacterial Vaginitis (gardnerella): POSITIVE — AB
Candida Glabrata: NEGATIVE
Candida Vaginitis: NEGATIVE
Chlamydia: NEGATIVE
Comment: NEGATIVE
Comment: NEGATIVE
Comment: NEGATIVE
Comment: NEGATIVE
Comment: NEGATIVE
Comment: NORMAL
Neisseria Gonorrhea: NEGATIVE
Trichomonas: NEGATIVE

## 2023-06-24 MED ORDER — METRONIDAZOLE 500 MG PO TABS: 500.0000 mg | ORAL_TABLET | Freq: Two times a day (BID) | ORAL | 0 refills | Status: AC

## 2023-06-24 NOTE — Progress Notes (Signed)
+  BV on vaginal swab, will rx flagyl, no sex or alcohol while taking meds

## 2023-09-16 ENCOUNTER — Encounter: Payer: Self-pay | Admitting: Advanced Practice Midwife

## 2024-02-17 DIAGNOSIS — R5383 Other fatigue: Secondary | ICD-10-CM | POA: Diagnosis not present

## 2024-02-17 DIAGNOSIS — Z1159 Encounter for screening for other viral diseases: Secondary | ICD-10-CM | POA: Diagnosis not present

## 2024-02-17 DIAGNOSIS — Z6827 Body mass index (BMI) 27.0-27.9, adult: Secondary | ICD-10-CM | POA: Diagnosis not present

## 2024-02-17 DIAGNOSIS — Z23 Encounter for immunization: Secondary | ICD-10-CM | POA: Diagnosis not present

## 2024-02-17 DIAGNOSIS — Z0000 Encounter for general adult medical examination without abnormal findings: Secondary | ICD-10-CM | POA: Diagnosis not present

## 2024-02-17 DIAGNOSIS — E559 Vitamin D deficiency, unspecified: Secondary | ICD-10-CM | POA: Diagnosis not present

## 2024-02-17 DIAGNOSIS — R030 Elevated blood-pressure reading, without diagnosis of hypertension: Secondary | ICD-10-CM | POA: Diagnosis not present

## 2024-02-17 DIAGNOSIS — Z79899 Other long term (current) drug therapy: Secondary | ICD-10-CM | POA: Diagnosis not present
